# Patient Record
Sex: Male | Born: 1935 | Race: Black or African American | Hispanic: No | Marital: Married | Smoking: Current every day smoker
Health system: Southern US, Community
[De-identification: ages and names within clinical notes are randomized; demographics above are authoritative.]

## PROBLEM LIST (undated history)

## (undated) DIAGNOSIS — M2041 Other hammer toe(s) (acquired), right foot: Secondary | ICD-10-CM

## (undated) DIAGNOSIS — R413 Other amnesia: Secondary | ICD-10-CM

## (undated) DIAGNOSIS — N400 Enlarged prostate without lower urinary tract symptoms: Secondary | ICD-10-CM

## (undated) DIAGNOSIS — E538 Deficiency of other specified B group vitamins: Secondary | ICD-10-CM

## (undated) DIAGNOSIS — I2510 Atherosclerotic heart disease of native coronary artery without angina pectoris: Secondary | ICD-10-CM

## (undated) DIAGNOSIS — I10 Essential (primary) hypertension: Secondary | ICD-10-CM

## (undated) DIAGNOSIS — E7800 Pure hypercholesterolemia, unspecified: Secondary | ICD-10-CM

## (undated) DIAGNOSIS — E519 Thiamine deficiency, unspecified: Secondary | ICD-10-CM

## (undated) DIAGNOSIS — F1027 Alcohol dependence with alcohol-induced persisting dementia: Secondary | ICD-10-CM

## (undated) DIAGNOSIS — F1020 Alcohol dependence, uncomplicated: Secondary | ICD-10-CM

## (undated) HISTORY — PX: CARDIAC CATHETERIZATION: SHX172

## (undated) HISTORY — PX: HERNIA REPAIR: SHX51

## (undated) HISTORY — DX: Atherosclerotic heart disease of native coronary artery without angina pectoris: I25.10

## (undated) HISTORY — PX: ACHILLES TENDON REPAIR: SUR1153

## (undated) HISTORY — PX: INGUINAL HERNIA REPAIR: SUR1180

## (undated) HISTORY — DX: Benign prostatic hyperplasia without lower urinary tract symptoms: N40.0

## (undated) HISTORY — DX: Thiamine deficiency, unspecified: E51.9

## (undated) HISTORY — DX: Other hammer toe(s) (acquired), right foot: M20.41

## (undated) HISTORY — DX: Pure hypercholesterolemia, unspecified: E78.00

## (undated) HISTORY — DX: Other amnesia: R41.3

## (undated) HISTORY — DX: Alcohol dependence, uncomplicated: F10.20

## (undated) HISTORY — DX: Deficiency of other specified B group vitamins: E53.8

## (undated) HISTORY — DX: Alcohol dependence with alcohol-induced persisting dementia: F10.27

## (undated) HISTORY — DX: Essential (primary) hypertension: I10

---

## 1998-11-05 ENCOUNTER — Emergency Department (HOSPITAL_COMMUNITY): Admission: EM | Admit: 1998-11-05 | Discharge: 1998-11-05 | Payer: Self-pay | Admitting: Emergency Medicine

## 2010-10-07 ENCOUNTER — Encounter
Admission: RE | Admit: 2010-10-07 | Discharge: 2010-10-07 | Payer: Self-pay | Source: Home / Self Care | Attending: Cardiology | Admitting: Cardiology

## 2011-01-27 ENCOUNTER — Other Ambulatory Visit: Payer: Self-pay | Admitting: Cardiology

## 2011-01-27 ENCOUNTER — Ambulatory Visit
Admission: RE | Admit: 2011-01-27 | Discharge: 2011-01-27 | Disposition: A | Discharge status: Home / Self Care | Payer: Medicare Other | Source: Ambulatory Visit | Attending: Cardiology | Admitting: Cardiology

## 2011-01-27 DIAGNOSIS — R55 Syncope and collapse: Secondary | ICD-10-CM

## 2011-02-01 ENCOUNTER — Other Ambulatory Visit: Payer: Self-pay | Admitting: Cardiology

## 2011-02-01 DIAGNOSIS — R55 Syncope and collapse: Secondary | ICD-10-CM

## 2011-02-04 ENCOUNTER — Other Ambulatory Visit: Payer: Medicare Other

## 2011-10-10 ENCOUNTER — Emergency Department (HOSPITAL_COMMUNITY): Payer: Medicare Other

## 2011-10-10 ENCOUNTER — Other Ambulatory Visit: Payer: Self-pay

## 2011-10-10 ENCOUNTER — Emergency Department (HOSPITAL_COMMUNITY)
Admission: EM | Admit: 2011-10-10 | Discharge: 2011-10-10 | Disposition: A | Discharge status: Home / Self Care | Payer: Medicare Other | Attending: Emergency Medicine | Admitting: Emergency Medicine

## 2011-10-10 ENCOUNTER — Encounter: Payer: Self-pay | Admitting: *Deleted

## 2011-10-10 DIAGNOSIS — Z7982 Long term (current) use of aspirin: Secondary | ICD-10-CM | POA: Insufficient documentation

## 2011-10-10 DIAGNOSIS — Z79899 Other long term (current) drug therapy: Secondary | ICD-10-CM | POA: Insufficient documentation

## 2011-10-10 DIAGNOSIS — R079 Chest pain, unspecified: Secondary | ICD-10-CM | POA: Insufficient documentation

## 2011-10-10 DIAGNOSIS — R209 Unspecified disturbances of skin sensation: Secondary | ICD-10-CM | POA: Insufficient documentation

## 2011-10-10 LAB — POCT I-STAT, CHEM 8
Chloride: 105 mEq/L (ref 96–112)
Creatinine, Ser: 0.9 mg/dL (ref 0.50–1.35)
Glucose, Bld: 114 mg/dL — ABNORMAL HIGH (ref 70–99)
HCT: 45 % (ref 39.0–52.0)
Potassium: 3.9 mEq/L (ref 3.5–5.1)
Sodium: 139 mEq/L (ref 135–145)

## 2011-10-10 LAB — DIFFERENTIAL
Lymphocytes Relative: 35 % (ref 12–46)
Monocytes Absolute: 0.7 10*3/uL (ref 0.1–1.0)
Monocytes Relative: 11 % (ref 3–12)
Neutro Abs: 3.6 10*3/uL (ref 1.7–7.7)
Neutrophils Relative %: 53 % (ref 43–77)

## 2011-10-10 LAB — CBC
HCT: 40.7 % (ref 39.0–52.0)
Hemoglobin: 13.3 g/dL (ref 13.0–17.0)
RBC: 4.1 MIL/uL — ABNORMAL LOW (ref 4.22–5.81)
WBC: 6.7 10*3/uL (ref 4.0–10.5)

## 2011-10-10 MED ORDER — LANSOPRAZOLE 15 MG PO CPDR: 15.0000 mg | DELAYED_RELEASE_CAPSULE | Freq: Every day | ORAL | Status: DC

## 2011-10-10 MED ORDER — SODIUM CHLORIDE 0.9 % IV SOLN
INTRAVENOUS | Status: DC
  Administered 2011-10-10: 09:00:00 via INTRAVENOUS

## 2011-10-10 NOTE — ED Notes (Signed)
C/o feeling of pressure in left chest and numbness in left arm for about 2 days now

## 2011-10-10 NOTE — ED Notes (Signed)
Patient transported to X-ray 

## 2011-10-10 NOTE — ED Provider Notes (Signed)
History     CSN: [id number] Arrival date & time: 10/10/2011  8:24 AM   First MD Initiated Contact with Patient 10/10/11 [phone]      Chief Complaint  Patient presents with  . Chest Pain    (Consider location/radiation/quality/duration/timing/severity/associated sxs/prior treatment) The history is provided by the patient.   patient's had some pressure in his left chest and some numbness in his left arm for the last 2 days. He states it comes and goes. It lasts 5-10 minutes. He cannot bring it on he cannot make it go away. No cough or fevers. No nausea vomiting or diarrhea. No diaphoresis. He states exertion does not make it come on. He states it tends to come on when he lays down at night. He's not had pain like this before. He states that he recently quit smoking after smoking a pack a day for years. He does not have any cardiac problems and no smoke. She's not lost any weight. He states that Dr. Gwenyth Bouillon is his primary care Dr. is not of any pain now. He came to the ER today because his daughter made him.  History reviewed. No pertinent past medical history.  Past Surgical History  Procedure Date  . Hernia repair     No family history on file.  History  Substance Use Topics  . Smoking status: Current Everyday Smoker  . Smokeless tobacco: Not on file  . Alcohol Use: Yes      Review of Systems  Constitutional: Negative for activity change and appetite change.  HENT: Negative for neck stiffness.   Eyes: Negative for pain.  Respiratory: Negative for chest tightness and shortness of breath.   Cardiovascular: Positive for chest pain. Negative for leg swelling.  Gastrointestinal: Negative for nausea, vomiting, abdominal pain and diarrhea.  Genitourinary: Negative for flank pain.  Musculoskeletal: Negative for back pain.  Skin: Negative for rash.  Neurological: Positive for numbness. Negative for weakness and headaches.  Psychiatric/Behavioral: Negative for behavioral problems.      Allergies  Review of patient's allergies indicates no known allergies.  Home Medications   Current Outpatient Rx  Name Route Sig Dispense Refill  . AMLODIPINE BESYLATE-VALSARTAN 5-320 MG PO TABS Oral Take 1 tablet by mouth daily.      . ASPIRIN 325 MG PO TABS Oral Take 325 mg by mouth daily.      Marland Kitchen LANSOPRAZOLE 15 MG PO CPDR Oral Take 1 capsule (15 mg total) by mouth daily. 14 capsule 0    BP 169/74  Pulse 78  Temp(Src) 97.7 F (36.5 C) (Oral)  Resp 13  SpO2 100%  Physical Exam  Nursing note and vitals reviewed. Constitutional: He is oriented to person, place, and time. He appears well-developed and well-nourished.  HENT:  Head: Normocephalic and atraumatic.  Eyes: EOM are normal. Pupils are equal, round, and reactive to light.  Neck: Normal range of motion. Neck supple.  Cardiovascular: Normal rate, regular rhythm and normal heart sounds.   No murmur heard. Pulmonary/Chest: Effort normal and breath sounds normal.  Abdominal: Soft. Bowel sounds are normal. He exhibits no distension and no mass. There is no tenderness. There is no rebound and no guarding.  Musculoskeletal: Normal range of motion. He exhibits no edema.  Neurological: He is alert and oriented to person, place, and time. No cranial nerve deficit.  Skin: Skin is warm and dry.  Psychiatric: He has a normal mood and affect.    ED Course  Procedures (including critical care time)  Labs  Reviewed  CBC - Abnormal; Notable for the following:    RBC 4.10 (*)    All other components within normal limits  POCT I-STAT, CHEM 8 - Abnormal; Notable for the following:    Glucose, Bld 114 (*)    All other components within normal limits  DIFFERENTIAL  TROPONIN I  I-STAT, CHEM 8   Dg Chest 2 View  10/10/2011  *RADIOLOGY REPORT*  Clinical Data: Chest pain  CHEST - 2 VIEW  Comparison: 10/07/2010  Findings: Cardiomediastinal silhouette is stable.  No acute infiltrate or pleural effusion.  No pulmonary edema.  Mild  hyperinflation.  Stable small calcified granuloma in right midlung. Mild degenerative changes thoracic spine.  IMPRESSION: Mild hyperinflation.  No active disease.  Mild degenerative changes thoracic spine.  Original Report Authenticated By: Natasha Mead, M.D.     1. Chest pain      Date: 10/10/2011  Rate: 84  Rhythm: normal sinus rhythm  QRS Axis: normal  Intervals: normal  ST/T Wave abnormalities: normal  Conduction Disutrbances:none  Narrative Interpretation:   Old EKG Reviewed: none available    MDM  Patient presents with episodes of left-sided chest pain. Is not associated with exertion.  possible given his EKG is normal. It comes on with him lying down. It is not her cardiac history although he was a smoker. He is pain-free now. The patient's cardiologist is Dr. Sharyn Lull.I discussed the patient with them he will see the patient in followup. I was struck the patient on a PPI and see if it helps.   Juliet Rude. Rubin Payor, MD 10/10/11 1038

## 2011-10-10 NOTE — ED Notes (Signed)
Pt. On monitor. 

## 2011-10-10 NOTE — ED Notes (Signed)
Dr. Pickering MD at bedside. 

## 2011-10-18 ENCOUNTER — Inpatient Hospital Stay (HOSPITAL_BASED_OUTPATIENT_CLINIC_OR_DEPARTMENT_OTHER)
Admission: RE | Admit: 2011-10-18 | Discharge: 2011-10-18 | Disposition: A | Discharge status: Home / Self Care | Payer: Medicare Other | Source: Ambulatory Visit | Attending: Cardiology | Admitting: Cardiology

## 2011-10-18 ENCOUNTER — Encounter (HOSPITAL_BASED_OUTPATIENT_CLINIC_OR_DEPARTMENT_OTHER)
Admission: RE | Disposition: A | Discharge status: Home / Self Care | Payer: Self-pay | Source: Ambulatory Visit | Attending: Cardiology

## 2011-10-18 DIAGNOSIS — E7800 Pure hypercholesterolemia, unspecified: Secondary | ICD-10-CM | POA: Insufficient documentation

## 2011-10-18 DIAGNOSIS — I10 Essential (primary) hypertension: Secondary | ICD-10-CM | POA: Insufficient documentation

## 2011-10-18 DIAGNOSIS — R0789 Other chest pain: Secondary | ICD-10-CM | POA: Insufficient documentation

## 2011-10-18 DIAGNOSIS — I2510 Atherosclerotic heart disease of native coronary artery without angina pectoris: Secondary | ICD-10-CM | POA: Insufficient documentation

## 2011-10-18 DIAGNOSIS — F17200 Nicotine dependence, unspecified, uncomplicated: Secondary | ICD-10-CM | POA: Insufficient documentation

## 2011-10-18 SURGERY — JV LEFT HEART CATHETERIZATION WITH CORONARY ANGIOGRAM
Anesthesia: Moderate Sedation

## 2011-10-18 MED ORDER — SODIUM CHLORIDE 0.9 % IV SOLN
INTRAVENOUS | Status: DC
  Administered 2011-10-18: 07:00:00 via INTRAVENOUS

## 2011-10-18 MED ORDER — OXYCODONE-ACETAMINOPHEN 5-325 MG PO TABS: 1.0000 | ORAL_TABLET | ORAL | Status: DC | PRN

## 2011-10-18 MED ORDER — ONDANSETRON HCL 4 MG/2ML IJ SOLN: 4.0000 mg | Freq: Four times a day (QID) | INTRAMUSCULAR | Status: DC | PRN

## 2011-10-18 MED ORDER — ACETAMINOPHEN 325 MG PO TABS: 650.0000 mg | ORAL_TABLET | ORAL | Status: DC | PRN

## 2011-10-18 MED ORDER — DIAZEPAM 5 MG PO TABS
5.0000 mg | ORAL_TABLET | Freq: Once | ORAL | Status: AC
  Administered 2011-10-18: 5 mg via ORAL

## 2011-10-18 MED ORDER — SODIUM CHLORIDE 0.9 % IV SOLN: INTRAVENOUS | Status: DC

## 2011-10-18 NOTE — H&P (Signed)
  Please see scanned H&P in chart

## 2011-10-18 NOTE — Progress Notes (Signed)
Discharge instructions completed, patient voiced understanding.  Ambulated to bathroom without bleeding from right groin site.  Discharged to home via wheelchair with daughter.

## 2011-10-18 NOTE — Cardiovascular Report (Signed)
NAME:  Michael Sharp, Michael Sharp NO.:  [id number]  MEDICAL RECORD NO.:  [id number]  LOCATION:                                 FACILITY:  PHYSICIAN:  Shea Swalley N. Sharyn Lull, M.D. DATE OF BIRTH:  Jun 09, 1935  DATE OF PROCEDURE: DATE OF DISCHARGE:  10/18/2011                           CARDIAC CATHETERIZATION   PROCEDURE:  Left cardiac cath with selective left and right coronary angiography, LV graphy via right groin using Judkins technique.  INDICATION FOR THE PROCEDURE:  Michael Sharp is a 76 year old black male with past medical history significant for hypertension, hypercholesteremia, tobacco abuse, complains of retrosternal chest pain described as tightness associated with numbness in the left arm.  The patient states he took aspirin with relief of chest pain.  Denies any nausea, vomiting, diaphoresis.  Denies palpitation, lightheadedness, or syncope.  Denies any exertional chest pain but activity is limited. Denies any claudication pain.  Denies any relation of chest pain to food, breathing, or movement.  PAST MEDICAL HISTORY:  As above.  PAST SURGICAL HISTORY:  He had inguinal hernia repair in the past.  SOCIAL HISTORY:  He is married, has 2 children.  Retired from [employer]. He smokes half pack per day for 55 years.  Continues to smoke.  He drinks beer socially.  ALLERGIES:  No known drug allergies.  MEDICATIONS AT HOME: 1. He is on Exforge 5/160 p.o. daily. 2. Enteric-coated aspirin 1 daily. 3. Toprol-XL 25 mg p.o. daily. 4. Nitrostat sublingual p.r.n.  FAMILY HISTORY:  Noncontributory.  PHYSICAL EXAMINATION:  GENERAL: On examination, he was alert, awake, oriented x3, in no acute distress. VITAL SIGNS: Blood pressure of 140/80, pulse was 86 regular. EYES: Conjunctivae were pink. NECK: Supple.  No JVD.  No bruits. LUNGS: Clear to auscultation without rhonchi or rales. CARDIOVASCULAR: S1, S2 are normal.  There was soft systolic murmur.  No S3 or  gallop. ABDOMEN: Soft.  Bowel sounds were present, nontender. EXTREMITIES:  There is no clubbing, cyanosis, or edema.  Discussed with the patient at length regarding noninvasive stress testing versus left cath, its risks and benefits, i.e., death, MI, stroke, local vascular complications, etc. and consented for the procedure.  PROCEDURE:  After obtaining the informed consent, the patient was brought to the cath lab and was placed on fluoroscopy table.  Right groin was prepped and draped in the usual fashion.  Xylocaine 1% was used for local anesthesia in the right groin.  With the help of thin wall needle, 4-French arterial sheath was placed.  The sheath was aspirated and flushed.  Next, 4-French left Judkins catheter was advanced over the wire under fluoroscopic guidance up to the ascending aorta.  Wire was pulled out.  The catheter was aspirated and connected to the Manifold.  Catheter was further advanced and engaged into left coronary ostium.  Multiple views of the left system were taken.  Next, catheter was disengaged and was replaced with 4-French 3D right diagnostic catheter which was advanced over the wire under fluoroscopic guidance up to the ascending aorta.  Wire was pulled out.  The catheter was aspirated and connected to the Manifold.  Catheter was further advanced and engaged into the right coronary ostium.  Multiple views of the right system were taken.  Next, catheter was disengaged and was pulled out over the wire and was replaced with 4-French pigtail catheter, which was advanced over the wire under fluoroscopic guidance up to the ascending aorta.  Catheter was further advanced across the aortic valve into the LV.  LV pressures were recorded.  Next, LV graft was done in 30 degree RAO position.  Postangiographic pressures were recorded from LV and then pullback pressures were recorded from the aorta.  There was no gradient across the aortic valve.  Next, the pigtail  catheter was pulled out over the wire.  Sheaths were aspirated and flushed.  FINDINGS:  LV showed good LV systolic function, mild LVH, EF of 55-60%. Left main was patent.  LAD has 15-20% mid stenosis.  Diagonal 1 to diagonal 3 were very very small, which were patent.  Left circumflex was 5-10% proximal stenosis.  OM-1 has 20-30% ostial stenosis.  OM-2 was very far with 25% proximal stenosis.  RCA has 5-10% mid stenosis.  PDA and PLV branches were patent.  The patient tolerated procedure well.  There were no complications.  The patient was transferred to the recovery room.     Michael Sharp. Sharyn Lull, M.D.     MNH/MEDQ  D:  10/18/2011  T:  10/18/2011  Job:  [id number]

## 2011-10-18 NOTE — Op Note (Signed)
Cardiac cath report dictated on 10/17/2004 dictation number is [id number]

## 2011-10-18 NOTE — Progress Notes (Signed)
4 F arterial sheath removed by Ashley Murrain post cardiac cath without difficulty.  Manual pressure held  For 20 minutes.

## 2011-10-18 NOTE — Progress Notes (Signed)
Bedrest begins @ 24.  Activity limitations discussed.

## 2011-12-14 DIAGNOSIS — H2510 Age-related nuclear cataract, unspecified eye: Secondary | ICD-10-CM | POA: Diagnosis not present

## 2011-12-14 DIAGNOSIS — H40029 Open angle with borderline findings, high risk, unspecified eye: Secondary | ICD-10-CM | POA: Diagnosis not present

## 2011-12-14 DIAGNOSIS — H524 Presbyopia: Secondary | ICD-10-CM | POA: Diagnosis not present

## 2012-01-26 DIAGNOSIS — I10 Essential (primary) hypertension: Secondary | ICD-10-CM | POA: Diagnosis not present

## 2012-01-26 DIAGNOSIS — I2510 Atherosclerotic heart disease of native coronary artery without angina pectoris: Secondary | ICD-10-CM | POA: Diagnosis not present

## 2012-01-26 DIAGNOSIS — E7800 Pure hypercholesterolemia, unspecified: Secondary | ICD-10-CM | POA: Diagnosis not present

## 2012-05-01 DIAGNOSIS — I10 Essential (primary) hypertension: Secondary | ICD-10-CM | POA: Diagnosis not present

## 2012-05-01 DIAGNOSIS — I2510 Atherosclerotic heart disease of native coronary artery without angina pectoris: Secondary | ICD-10-CM | POA: Diagnosis not present

## 2012-08-13 DIAGNOSIS — Z79899 Other long term (current) drug therapy: Secondary | ICD-10-CM | POA: Diagnosis not present

## 2012-08-14 DIAGNOSIS — H40029 Open angle with borderline findings, high risk, unspecified eye: Secondary | ICD-10-CM | POA: Diagnosis not present

## 2012-08-22 DIAGNOSIS — Z79899 Other long term (current) drug therapy: Secondary | ICD-10-CM | POA: Diagnosis not present

## 2012-08-31 DIAGNOSIS — Z79899 Other long term (current) drug therapy: Secondary | ICD-10-CM | POA: Diagnosis not present

## 2012-09-07 DIAGNOSIS — Z79899 Other long term (current) drug therapy: Secondary | ICD-10-CM | POA: Diagnosis not present

## 2012-09-10 DIAGNOSIS — Z79899 Other long term (current) drug therapy: Secondary | ICD-10-CM | POA: Diagnosis not present

## 2012-09-17 DIAGNOSIS — Z79899 Other long term (current) drug therapy: Secondary | ICD-10-CM | POA: Diagnosis not present

## 2012-12-17 DIAGNOSIS — Z79899 Other long term (current) drug therapy: Secondary | ICD-10-CM | POA: Diagnosis not present

## 2013-04-02 DIAGNOSIS — H40029 Open angle with borderline findings, high risk, unspecified eye: Secondary | ICD-10-CM | POA: Diagnosis not present

## 2013-04-29 DIAGNOSIS — M7150 Other bursitis, not elsewhere classified, unspecified site: Secondary | ICD-10-CM | POA: Diagnosis not present

## 2013-04-29 DIAGNOSIS — M79609 Pain in unspecified limb: Secondary | ICD-10-CM | POA: Diagnosis not present

## 2013-04-29 DIAGNOSIS — M2040 Other hammer toe(s) (acquired), unspecified foot: Secondary | ICD-10-CM | POA: Diagnosis not present

## 2013-05-13 DIAGNOSIS — M25579 Pain in unspecified ankle and joints of unspecified foot: Secondary | ICD-10-CM | POA: Diagnosis not present

## 2013-05-13 DIAGNOSIS — M2040 Other hammer toe(s) (acquired), unspecified foot: Secondary | ICD-10-CM | POA: Diagnosis not present

## 2014-03-17 DIAGNOSIS — D2370 Other benign neoplasm of skin of unspecified lower limb, including hip: Secondary | ICD-10-CM | POA: Diagnosis not present

## 2014-12-18 ENCOUNTER — Encounter: Payer: Self-pay | Admitting: Internal Medicine

## 2014-12-18 ENCOUNTER — Ambulatory Visit (INDEPENDENT_AMBULATORY_CARE_PROVIDER_SITE_OTHER): Payer: Medicare Other | Admitting: Internal Medicine

## 2014-12-18 VITALS — BP 168/93 | HR 111 | Temp 98.2°F | Resp 16 | Ht 72.0 in | Wt 171.0 lb

## 2014-12-18 DIAGNOSIS — I10 Essential (primary) hypertension: Secondary | ICD-10-CM | POA: Diagnosis not present

## 2014-12-18 DIAGNOSIS — Z720 Tobacco use: Secondary | ICD-10-CM

## 2014-12-18 DIAGNOSIS — F17200 Nicotine dependence, unspecified, uncomplicated: Secondary | ICD-10-CM | POA: Insufficient documentation

## 2014-12-18 DIAGNOSIS — R102 Pelvic and perineal pain: Secondary | ICD-10-CM | POA: Insufficient documentation

## 2014-12-18 DIAGNOSIS — R1030 Lower abdominal pain, unspecified: Secondary | ICD-10-CM | POA: Diagnosis not present

## 2014-12-18 DIAGNOSIS — Z23 Encounter for immunization: Secondary | ICD-10-CM | POA: Diagnosis not present

## 2014-12-18 DIAGNOSIS — R109 Unspecified abdominal pain: Secondary | ICD-10-CM | POA: Diagnosis not present

## 2014-12-18 LAB — CBC WITH DIFFERENTIAL/PLATELET
Basophils Absolute: 0 10*3/uL (ref 0.0–0.1)
Basophils Relative: 0 % (ref 0–1)
EOS ABS: 0 10*3/uL (ref 0.0–0.7)
Eosinophils Relative: 0 % (ref 0–5)
HEMATOCRIT: 46.5 % (ref 39.0–52.0)
HEMOGLOBIN: 15.9 g/dL (ref 13.0–17.0)
LYMPHS ABS: 1.8 10*3/uL (ref 0.7–4.0)
LYMPHS PCT: 36 % (ref 12–46)
MCH: 34.7 pg — ABNORMAL HIGH (ref 26.0–34.0)
MCHC: 34.2 g/dL (ref 30.0–36.0)
MCV: 101.5 fL — ABNORMAL HIGH (ref 78.0–100.0)
MPV: 9.7 fL (ref 8.6–12.4)
Monocytes Absolute: 0.7 10*3/uL (ref 0.1–1.0)
Monocytes Relative: 14 % — ABNORMAL HIGH (ref 3–12)
NEUTROS ABS: 2.5 10*3/uL (ref 1.7–7.7)
NEUTROS PCT: 50 % (ref 43–77)
Platelets: 180 10*3/uL (ref 150–400)
RBC: 4.58 MIL/uL (ref 4.22–5.81)
RDW: 13.8 % (ref 11.5–15.5)
WBC: 5 10*3/uL (ref 4.0–10.5)

## 2014-12-18 MED ORDER — AMLODIPINE BESYLATE 5 MG PO TABS: 5.0000 mg | ORAL_TABLET | Freq: Every day | ORAL | Status: DC

## 2014-12-18 MED ORDER — HYDROCHLOROTHIAZIDE 12.5 MG PO CAPS: 12.5000 mg | ORAL_CAPSULE | Freq: Every day | ORAL | Status: DC

## 2014-12-18 NOTE — Progress Notes (Signed)
Patient ID: Michael Sharp, male   DOB: 27-Oct-1935, 80 y.o.   MRN: [id number]   Michael Sharp, is a 80 y.o. male  WUJ:[id number]  NFA:[id number]  DOB - [birth year]  CC:  Chief Complaint  Patient presents with  . Establish Care    discomfort around lower abdomen x 3 weeks        HPI: Michael Sharp is a 80 y.o. male here today to establish medical care. He reports that he has had no problems and is healthy. He was last seen by Dr Wallene Huh and thinks that his last visit was more than 2 years ago. He does smoke but denies any SOB or DOE  He also c/o pain in the supra pubic region which started several weeks ago. He denies any urinary symptoms and states that his bowel and bladder habits have been normal.  Patient has No headache, No chest pain, No abdominal pain - No Nausea, No new weakness tingling or numbness, No Cough - SOB.  No Known Allergies History reviewed. No pertinent past medical history. Current Outpatient Prescriptions on File Prior to Visit  Medication Sig Dispense Refill  . aspirin 325 MG tablet Take 325 mg by mouth daily.       No current facility-administered medications on file prior to visit.   Family History  Problem Relation Age of Onset  . Cancer Mother   . Cancer Father    History   Social History  . Marital Status: Married    Spouse Name: N/A  . Number of Children: 2  . Years of Education: College   Occupational History  . Not on file.   Social History Main Topics  . Smoking status: Current Every Day Smoker -- 0.50 packs/day for 60 years    Types: Cigarettes  . Smokeless tobacco: Not on file  . Alcohol Use: 8.4 oz/week    14 Cans of beer per week  . Drug Use: No  . Sexual Activity: Not on file   Other Topics Concern  . Not on file   Social History Narrative    Review of Systems: Constitutional: Negative for fever, chills, diaphoresis, activity change, appetite change and fatigue. HENT: Negative for ear pain, nosebleeds,  congestion, facial swelling, rhinorrhea, neck pain, neck stiffness and ear discharge.  Eyes: Negative for pain, discharge, redness, itching and visual disturbance. Respiratory: Negative for cough, choking, chest tightness, shortness of breath, wheezing and stridor.  Cardiovascular: Negative for chest pain, palpitations and leg swelling. Gastrointestinal: Negative for abdominal distention. Genitourinary: Negative for dysuria, urgency, frequency, hematuria, flank pain, decreased urine volume, difficulty urinating and dyspareunia.  Musculoskeletal: Negative for back pain, joint swelling, arthralgia and gait problem. Neurological: Negative for dizziness, tremors, seizures, syncope, facial asymmetry, speech difficulty, weakness, light-headedness, numbness and headaches.  Hematological: Negative for adenopathy. Does not bruise/bleed easily. Psychiatric/Behavioral: Negative for hallucinations, behavioral problems, confusion, dysphoric mood, decreased concentration and agitation.     Objective:    Filed Vitals:   12/18/14 1401  BP: 168/93  Pulse: 111  Temp: 98.2 F (36.8 C)  Resp: 16    Physical Exam: Constitutional: Patient appears well-developed and well-nourished. No distress. HENT: Normocephalic, atraumatic, External right and left ear normal. Oropharynx is clear and moist.  Eyes: Conjunctivae and EOM are normal. PERRLA, no scleral icterus. Neck: Normal ROM. Neck supple. No JVD. No tracheal deviation. No thyromegaly. CVS: RRR, S1/S2 +, no murmurs, no gallops, no carotid bruit.  Pulmonary: Effort and breath sounds normal, no stridor, rhonchi, wheezes, rales.  Abdominal: Soft. BS +, no distension, tenderness, rebound or guarding.  Musculoskeletal: Normal range of motion. No edema and no tenderness.  Lymphadenopathy: No lymphadenopathy noted, cervical, inguinal or axillary Neuro: Alert. Normal reflexes, muscle tone coordination. No cranial nerve deficit. Skin: Skin is warm and dry. No  rash noted. Not diaphoretic. No erythema. No pallor. Psychiatric: Normal mood and affect. Behavior, judgment, thought content normal.   Lab Results  Component Value Date   WBC 6.7 10/10/2011   HGB 15.3 10/10/2011   HCT 45.0 10/10/2011   MCV 99.3 10/10/2011   PLT 184 10/10/2011   Lab Results  Component Value Date   CREATININE 0.90 10/10/2011   BUN 13 10/10/2011   NA 139 10/10/2011   K 3.9 10/10/2011   CL 105 10/10/2011    No results found for: HGBA1C Lipid Panel  No results found for: CHOL, TRIG, HDL, CHOLHDL, VLDL, LDLCALC     Assessment and plan:   1. Essential hypertension - I have started Norvasc  - Comprehensive metabolic panel - amLODipine (NORVASC) 5 MG tablet; Take 1 tablet (5 mg total) by mouth daily.  Dispense: 90 tablet; Refill: 3 - hydrochlorothiazide (MICROZIDE) 12.5 MG capsule; Take 1 capsule (12.5 mg total) by mouth daily.  Dispense: 30 capsule; Refill: 1  2. Abdominal pain, suprapubic, unspecified laterality - The pain is diffuse and clinical course unclear based on history given by patient. Will evaluate for UTI and diverticular disease. He has no signs of appendicitis and no evidence of hernia. - CBC with Differential/Platelet - Urinalysis with Culture Reflex - CT Abdomen Pelvis W Contrast; Future  3. Tobacco use disorder - Discussed tobacco cessation,. He feels that he will be successful with patches. I have given him th number to the quit line. He is approaching hte contemplative stage and will continue with brief interventions.  4. Immunization due - Pneumococcal conjugate vaccine 13-valent   Follow-up in 1 month for Annual visit, HTN and review of labs.  The patient was given clear instructions to go to ER or return to medical center if symptoms don't improve, worsen or new problems develop. The patient verbalized understanding. The patient was told to call to get lab results if they haven't heard anything in the next week.     This note has  been created with Surveyor, quantity. Any transcriptional errors are unintentional.    MATTHEWS,MICHELLE A., MD [place], [place]   12/18/2014, 3:03 PM

## 2014-12-19 LAB — URINALYSIS W MICROSCOPIC + REFLEX CULTURE
BILIRUBIN URINE: NEGATIVE
Bacteria, UA: NONE SEEN
Casts: NONE SEEN
Crystals: NONE SEEN
GLUCOSE, UA: NEGATIVE mg/dL
HGB URINE DIPSTICK: NEGATIVE
KETONES UR: NEGATIVE mg/dL
Leukocytes, UA: NEGATIVE
Nitrite: NEGATIVE
PROTEIN: NEGATIVE mg/dL
Specific Gravity, Urine: 1.008 (ref 1.005–1.030)
Squamous Epithelial / LPF: NONE SEEN
Urobilinogen, UA: 0.2 mg/dL (ref 0.0–1.0)
pH: 6 (ref 5.0–8.0)

## 2014-12-19 LAB — COMPREHENSIVE METABOLIC PANEL
ALBUMIN: 4.2 g/dL (ref 3.5–5.2)
ALT: 20 U/L (ref 0–53)
AST: 29 U/L (ref 0–37)
Alkaline Phosphatase: 71 U/L (ref 39–117)
BUN: 7 mg/dL (ref 6–23)
CALCIUM: 9.6 mg/dL (ref 8.4–10.5)
CHLORIDE: 103 meq/L (ref 96–112)
CO2: 22 meq/L (ref 19–32)
Creat: 1.01 mg/dL (ref 0.50–1.35)
Glucose, Bld: 88 mg/dL (ref 70–99)
POTASSIUM: 4.3 meq/L (ref 3.5–5.3)
SODIUM: 135 meq/L (ref 135–145)
TOTAL PROTEIN: 7.8 g/dL (ref 6.0–8.3)
Total Bilirubin: 0.8 mg/dL (ref 0.2–1.2)

## 2014-12-29 ENCOUNTER — Ambulatory Visit (HOSPITAL_COMMUNITY)
Admission: RE | Admit: 2014-12-29 | Discharge: 2014-12-29 | Disposition: A | Discharge status: Home / Self Care | Payer: Medicare Other | Source: Ambulatory Visit | Attending: Internal Medicine | Admitting: Internal Medicine

## 2014-12-29 ENCOUNTER — Other Ambulatory Visit (HOSPITAL_COMMUNITY): Payer: Medicare Other

## 2014-12-29 ENCOUNTER — Other Ambulatory Visit: Payer: Self-pay | Admitting: Internal Medicine

## 2014-12-29 ENCOUNTER — Encounter (HOSPITAL_COMMUNITY): Payer: Self-pay

## 2014-12-29 DIAGNOSIS — N400 Enlarged prostate without lower urinary tract symptoms: Secondary | ICD-10-CM

## 2014-12-29 DIAGNOSIS — R1030 Lower abdominal pain, unspecified: Secondary | ICD-10-CM | POA: Insufficient documentation

## 2014-12-29 DIAGNOSIS — R932 Abnormal findings on diagnostic imaging of liver and biliary tract: Secondary | ICD-10-CM

## 2014-12-29 DIAGNOSIS — N281 Cyst of kidney, acquired: Secondary | ICD-10-CM | POA: Diagnosis not present

## 2014-12-29 DIAGNOSIS — K5730 Diverticulosis of large intestine without perforation or abscess without bleeding: Secondary | ICD-10-CM | POA: Diagnosis not present

## 2014-12-29 MED ORDER — IOHEXOL 300 MG/ML  SOLN
100.0000 mL | Freq: Once | INTRAMUSCULAR | Status: AC | PRN
  Administered 2014-12-29: 100 mL via INTRAVENOUS

## 2014-12-29 NOTE — Progress Notes (Signed)
CT results call from Radiology call noting abnormality at head of pancreas. Will order MRI with Pancreas protocol. Also noted enlarged prostate. Will order PSA to be done prior to next visit

## 2014-12-30 ENCOUNTER — Ambulatory Visit (INDEPENDENT_AMBULATORY_CARE_PROVIDER_SITE_OTHER): Payer: Medicare Other

## 2014-12-30 DIAGNOSIS — N400 Enlarged prostate without lower urinary tract symptoms: Secondary | ICD-10-CM

## 2014-12-31 LAB — PSA: PSA: 4.8 ng/mL — ABNORMAL HIGH (ref <=4.00)

## 2015-01-12 ENCOUNTER — Other Ambulatory Visit: Payer: Self-pay | Admitting: Internal Medicine

## 2015-01-12 ENCOUNTER — Ambulatory Visit (HOSPITAL_COMMUNITY)
Admission: RE | Admit: 2015-01-12 | Discharge: 2015-01-12 | Disposition: A | Discharge status: Home / Self Care | Payer: Medicare Other | Source: Ambulatory Visit | Attending: Internal Medicine | Admitting: Internal Medicine

## 2015-01-12 DIAGNOSIS — R932 Abnormal findings on diagnostic imaging of liver and biliary tract: Secondary | ICD-10-CM | POA: Diagnosis not present

## 2015-01-12 DIAGNOSIS — R109 Unspecified abdominal pain: Secondary | ICD-10-CM | POA: Diagnosis not present

## 2015-01-12 DIAGNOSIS — K760 Fatty (change of) liver, not elsewhere classified: Secondary | ICD-10-CM | POA: Insufficient documentation

## 2015-01-12 MED ORDER — GADOBENATE DIMEGLUMINE 529 MG/ML IV SOLN
20.0000 mL | Freq: Once | INTRAVENOUS | Status: AC | PRN
  Administered 2015-01-12: 16 mL via INTRAVENOUS

## 2015-01-19 ENCOUNTER — Other Ambulatory Visit (INDEPENDENT_AMBULATORY_CARE_PROVIDER_SITE_OTHER): Payer: Medicare Other

## 2015-01-19 ENCOUNTER — Other Ambulatory Visit: Payer: Self-pay | Admitting: Family Medicine

## 2015-01-19 DIAGNOSIS — I10 Essential (primary) hypertension: Secondary | ICD-10-CM | POA: Diagnosis not present

## 2015-01-20 LAB — LIPID PANEL
CHOL/HDL RATIO: 4.3 ratio
CHOLESTEROL: 205 mg/dL — AB (ref 0–200)
HDL: 48 mg/dL (ref >=40)
LDL CALC: 121 mg/dL — AB (ref 0–99)
Triglycerides: 180 mg/dL — ABNORMAL HIGH (ref <150)
VLDL: 36 mg/dL (ref 0–40)

## 2015-01-26 ENCOUNTER — Ambulatory Visit (INDEPENDENT_AMBULATORY_CARE_PROVIDER_SITE_OTHER): Payer: Medicare Other | Admitting: Internal Medicine

## 2015-01-26 ENCOUNTER — Encounter: Payer: Self-pay | Admitting: Internal Medicine

## 2015-01-26 VITALS — BP 181/94 | HR 118 | Temp 99.2°F | Resp 16 | Ht 72.0 in | Wt 165.0 lb

## 2015-01-26 DIAGNOSIS — F17200 Nicotine dependence, unspecified, uncomplicated: Secondary | ICD-10-CM

## 2015-01-26 DIAGNOSIS — R269 Unspecified abnormalities of gait and mobility: Secondary | ICD-10-CM

## 2015-01-26 DIAGNOSIS — I10 Essential (primary) hypertension: Secondary | ICD-10-CM | POA: Diagnosis not present

## 2015-01-26 DIAGNOSIS — R251 Tremor, unspecified: Secondary | ICD-10-CM

## 2015-01-26 DIAGNOSIS — F1024 Alcohol dependence with alcohol-induced mood disorder: Secondary | ICD-10-CM | POA: Insufficient documentation

## 2015-01-26 DIAGNOSIS — Z23 Encounter for immunization: Secondary | ICD-10-CM | POA: Diagnosis not present

## 2015-01-26 DIAGNOSIS — F1020 Alcohol dependence, uncomplicated: Secondary | ICD-10-CM

## 2015-01-26 DIAGNOSIS — G3184 Mild cognitive impairment, so stated: Secondary | ICD-10-CM

## 2015-01-26 DIAGNOSIS — R000 Tachycardia, unspecified: Secondary | ICD-10-CM

## 2015-01-26 DIAGNOSIS — R258 Other abnormal involuntary movements: Secondary | ICD-10-CM

## 2015-01-26 DIAGNOSIS — Z720 Tobacco use: Secondary | ICD-10-CM

## 2015-01-26 DIAGNOSIS — E785 Hyperlipidemia, unspecified: Secondary | ICD-10-CM | POA: Diagnosis not present

## 2015-01-26 LAB — COMPREHENSIVE METABOLIC PANEL
ALBUMIN: 4.1 g/dL (ref 3.5–5.2)
ALK PHOS: 71 U/L (ref 39–117)
ALT: 23 U/L (ref 0–53)
AST: 30 U/L (ref 0–37)
BUN: 8 mg/dL (ref 6–23)
CHLORIDE: 102 meq/L (ref 96–112)
CO2: 23 mEq/L (ref 19–32)
Calcium: 9.7 mg/dL (ref 8.4–10.5)
Creat: 0.98 mg/dL (ref 0.50–1.35)
Glucose, Bld: 94 mg/dL (ref 70–99)
POTASSIUM: 4.2 meq/L (ref 3.5–5.3)
SODIUM: 137 meq/L (ref 135–145)
TOTAL PROTEIN: 7.7 g/dL (ref 6.0–8.3)
Total Bilirubin: 0.8 mg/dL (ref 0.2–1.2)

## 2015-01-26 MED ORDER — ATORVASTATIN CALCIUM 20 MG PO TABS
20.0000 mg | ORAL_TABLET | Freq: Every day | ORAL | Status: DC
Start: 2015-01-26 — End: 2016-03-21

## 2015-01-26 MED ORDER — FOLIC ACID 1 MG PO TABS: 1.0000 mg | ORAL_TABLET | Freq: Every day | ORAL | Status: DC

## 2015-01-26 MED ORDER — VITAMIN B-1 50 MG PO TABS: 50.0000 mg | ORAL_TABLET | Freq: Every day | ORAL | Status: DC

## 2015-01-26 NOTE — Progress Notes (Signed)
Patient ID: Michael Sharp, male   DOB: 1935-10-23, 80 y.o.   MRN: [id number]  HPI 80 y.o. male  presents for 3 month follow up with hypertension, hyperlipidemia, abdominal pain. His blood pressure has not been controlled at home, today their BP is BP: (!) 181/94 mmHg He does not workout. He denies chest pain, shortness of breath, dizziness.  He is not on cholesterol medication and denies myalgias. His cholesterol is not at goal. The cholesterol was:  01/19/2015: Cholesterol, Total 205*; HDL-C 48; LDL (calc) 121*; Triglycerides 180* No results found for requested labs within last 365 days. Patient is not on Vitamin D supplement. No results found for requested labs within last 365 days.   Pt's wife is present today and states that he has not been compliant with his medications. Additionally he drinks ETOH "all day long". Pt admits to drinking 6 beers/day. However his wife reports that she drinks an average of 10 beers/day and can drink up to 24 in a day. She reports that he also drinks Liquor ([employer]). He has not thought about cutting down, gets angry when criticized about drinking, does not feel guilty but does have a eye-opener. (Score 2/4: significant)  Pt also exhibiting confusion around simple instructions. I performed a MMS examination and patient scored 20/30.   Current Medications:  Current Outpatient Prescriptions on File Prior to Visit  Medication Sig Dispense Refill  . amLODipine (NORVASC) 5 MG tablet Take 1 tablet (5 mg total) by mouth daily. 90 tablet 3  . aspirin 325 MG tablet Take 325 mg by mouth daily.      . hydrochlorothiazide (MICROZIDE) 12.5 MG capsule Take 1 capsule (12.5 mg total) by mouth daily. 30 capsule 1  . Multiple Vitamin (MULTIVITAMIN) tablet Take 1 tablet by mouth daily.     No current facility-administered medications on file prior to visit.   Medical History: No past medical history on file. Allergies: No Known Allergies   Review of Systems:  Review  of Systems  Constitutional: Negative.   HENT: Negative.   Eyes: Negative.   Respiratory: Negative.   Cardiovascular: Negative.   Gastrointestinal: Negative.   Genitourinary: Negative.   Musculoskeletal: Negative.   Skin: Negative.   Neurological: Positive for tremors (If he does not have a drink in the morning.).  Endo/Heme/Allergies: Negative.   Psychiatric/Behavioral: Positive for memory loss and substance abuse. Negative for hallucinations. The patient is nervous/anxious.     Family history- Review and unchanged Social history- Review and unchanged Physical Exam: BP 181/94 mmHg  Pulse 118  Temp(Src) 99.2 F (37.3 C) (Oral)  Resp 16  Ht 6' (1.829 m)  Wt 165 lb (74.844 kg)  BMI 22.37 kg/m2  SpO2 100% Wt Readings from Last 3 Encounters:  01/26/15 165 lb (74.844 kg)  12/18/14 171 lb (77.565 kg)  10/18/11 170 lb (77.111 kg)   General Appearance: Malnourished, in no apparent distress. Eyes: PERRLA, EOMs, conjunctiva no swelling or erythema Sinuses: No Frontal/maxillary tenderness ENT/Mouth: Ext aud canals clear, TMs without erythema, bulging. No erythema, swelling, or exudate on post pharynx.  Tonsils not swollen or erythematous. Hearing normal.  Neck: Supple, thyroid normal. No JVD or Carotid Bruit Respiratory: Respiratory effort normal, BS equal bilaterally without rales, rhonchi, wheezing or stridor.  Cardio:Mild tachycardia with no MRGs. Brisk peripheral pulses without edema.  Abdomen: Soft, + BS.  Non tender, no guarding, rebound, hernias, masses. Lymphatics: Non tender without lymphadenopathy.  Musculoskeletal: Full ROM, normal strength, normal gait.  Skin: Warm, dry without  rashes, lesions, ecchymosis.  Neuro: Cranial nerves intact.Positive Romberg sign, unable to tandem walk but no shuffling noted in his gait pattern  noted. Pt exhibits intention tremor and bradykinesia, and also has micrographia. Sensation is intact.  Psych: Awake and oriented X 3, normal affect,  Insight and Judgment distorted.  MMS score 20/30  Assessment and Plan:  1. Hyperlipidemia LDL goal <100 - Pt has multiple risk factors for ASCVD. Will start on Lipitor 20 mg. - Comprehensive metabolic panel - atorvastatin (LIPITOR) 20 MG tablet; Take 1 tablet (20 mg total) by mouth daily.  Dispense: 90 tablet; Refill: 3  2. Essential hypertension - Pt has not been compliant with antihypertensive medications. So will continue current medications. I have discussed with Mr. Zukas and his wife the role of ETOH in maintaining elevated blood pressures, as well as the problem associated with ETOH dependence  And withdrawal syndrome. Also continue to .monitor blood pressure at home. Continue DASH diet. Reminder to go to the ER if any CP, SOB, nausea, dizziness, severe HA, changes vision/speech, left arm numbness and tingling and jaw pain. - Continue diet and exercise. Check cholesterol 6 weeks  -  Comprehensive metabolic panel  3. Tachycardia - my suspicion is that patient is having some mild withdrawal symptoms as he has had no ETOH today in anticipation of his Physician office visit. However I cannot ignore the possibilityof  - Ethanol - EKG 12-Lead - Comprehensive metabolic panel - TSH  4. Mild Cognitive Impairment - Will refer to Neurology. The differential includes, ETOH associated cognitive impairment, Parkinsonism and  Early Alzheimer's dementia. I do not think that imaging will be helpful in the setting of non-focal deficit.  - - thiamine (VITAMIN B-1) 50 MG tablet; Take 1 tablet (50 mg total) by mouth daily.  Dispense: 30 tablet; Refill: 11 - folic acid (FOLVITE) 1 MG tablet; Take 1 tablet (1 mg total) by mouth daily.  Dispense: 30 tablet; Refill: 11 - TSH  5. Uncomplicated alcohol dependence - Pt in stage of frank denial.  - Will start on Thiamine and folic acid. - Comprehensive metabolic panel  6. Need for Tdap vaccination - Tdap vaccine greater than or equal to 7yo IM  7.  Alcoholism - Counseled patient and wife on risks associated with ETOH dependence. Advised that he should start cutting down. However patient is in denial of his ETOH intake and is not ready to quit. - thiamine (VITAMIN B-1) 50 MG tablet; Take 1 tablet (50 mg total) by mouth daily.  Dispense: 30 tablet; Refill: 11 - folic acid (FOLVITE) 1 MG tablet; Take 1 tablet (1 mg total) by mouth daily.  Dispense: 30 tablet; Refill: 11    Continue diet and meds as discussed. Further disposition pending results of labs. Discussed medication effects and side effects.    Nandini Bogdanski A., MD 2:54 PM [facility]

## 2015-01-26 NOTE — Patient Instructions (Signed)
Mild Neurocognitive Disorder Mild neurocognitive disorder (formerly known as mild cognitive impairment) is a mental disorder. It is a slight abnormal decrease in mental function. The areas of mental function affected may include memory, thought, communication, behavior, and completion of tasks. The decrease is noticeable and measurable but for the most part does not interfere with your daily activities. Mild neurocognitive disorder typically occurs in people older than 60 years but can occur earlier. It is not as serious as major neurocognitive disorder (formerly known as dementia) but may lead to a more serious neurocognitive disorder. However, in some cases the condition does not get worse. A few people with this disorder even improve. CAUSES  There are a number of different causes of mild neurocognitive disorder:   Brain disorders associated with abnormal protein deposits, such as Alzheimer's disease, Pick's disease, and Lewy body disease.  Brain disorders associated with abnormal movement, such as Parkinson's disease and Huntington's disease.  Diseases affecting blood vessels in the brain and resulting in mini-strokes.  Certain infections, such as human immunodeficiency virus (HIV) infection.  Traumatic brain injury.  Other medical conditions such as brain tumors, underactive thyroid (hypothyroidism), and vitamin B12 deficiency.  Use of certain prescription medicine and "recreational" drugs. SYMPTOMS  Symptoms of mild neurocognitive disorder include:  Difficulty remembering. You may forget details of recent events, names, or phone numbers. You may forget important social events and appointments or repeatedly forget where you put your car keys.  Difficulty thinking and solving problems. You may have trouble with complex tasks such as paying bills or driving in unfamiliar locations.  Difficulty communicating. You may have trouble finding the right word, naming an object, forming a  sentence that makes sense, or understanding what you read or hear.  Changes in your behavior or personality. You may lose interest in the things that you used to enjoy or withdraw from social situations. You may get angry more easily than usual. You may act before thinking. You may do things in public that you would not usually do. You may hear or see things that are not real (hallucinations). You may believe falsely that others are trying to hurt you (paranoia). DIAGNOSIS Mild neurocognitive disorder is diagnosed through an assessment by your health care provider. Your health care provider will ask you and your family, friends, or coworkers questions about your symptoms. He or she will ask how often the symptoms occur, how long they have been occurring, whether they are getting worse, and the effect they are having on your life. Your health care provider may refer you to a neurologist or mental health specialist for a detailed evaluation of your mental functions (neuropsychological testing).  To identify the cause of your mild neurocognitive disorder, your health care provider may:  Obtain a detailed medical history.  Ask about alcohol and drug use, including prescription medicine.  Perform a physical exam.  Order blood tests and brain imaging exams. TREATMENT  Mild neurocognitive disorder caused by infections, use of certain medicines or "recreational" drugs, and certain medical conditions may improve with treatment of the condition that is causing the disorder. Mild neurocognitive disorder resulting from other causes generally does not improve and may worsen. In these cases, the goal of treatment is to slow progression of the disorder and help you cope with the loss of mental function. Treatments in these cases include:   Medicine. Medicine helps mainly with memory loss and behavioral symptoms.   Talk therapy. Talk therapy provides education, emotional support, memory aids, and other   include:   · Medicine. Medicine helps mainly with memory loss and behavioral symptoms.    · Talk therapy. Talk therapy provides education, emotional support, memory aids, and other ways of  making up for decreases in mental function.       · Lifestyle changes. These include regular exercise, a healthy diet (including essential omega-3 fatty acids), intellectual stimulation, and increased social interaction.  Document Released: 06/19/2013 Document Revised: 03/03/2014 Document Reviewed: 06/19/2013  [employer]® Patient Information ©2015 [employer]. This information is not intended to replace advice given to you by your health care provider. Make sure you discuss any questions you have with your health care provider.

## 2015-01-27 LAB — ETHANOL

## 2015-02-05 ENCOUNTER — Encounter: Payer: Self-pay | Admitting: Neurology

## 2015-02-05 ENCOUNTER — Ambulatory Visit (INDEPENDENT_AMBULATORY_CARE_PROVIDER_SITE_OTHER): Payer: Medicare Other | Admitting: Neurology

## 2015-02-05 VITALS — BP 138/84 | HR 88 | Ht 72.0 in | Wt 168.0 lb

## 2015-02-05 DIAGNOSIS — R413 Other amnesia: Secondary | ICD-10-CM | POA: Diagnosis not present

## 2015-02-05 DIAGNOSIS — R251 Tremor, unspecified: Secondary | ICD-10-CM

## 2015-02-05 DIAGNOSIS — F1020 Alcohol dependence, uncomplicated: Secondary | ICD-10-CM | POA: Diagnosis not present

## 2015-02-05 NOTE — Patient Instructions (Signed)
1. Your provider has requested that you have labwork completed today. Please go to [facility] on the first floor of this building before leaving the office today.

## 2015-02-05 NOTE — Progress Notes (Signed)
Subjective:   Michael Sharp was seen in consultation in the movement disorder clinic at the request of MATTHEWS,MICHELLE A., MD.  The evaluation is for tremor.  The records that were made available to me were reviewed.  The patient is a 80 y.o. right handed male with a history of tremor.  Pt states "personally I didn't even know that I had the shakes."  He states that his wife is in the lobby but he won't let her back in the examination room initally but she came back on her own when I was through with the history and already performing the examination.  There is no family hx of tremor.    Affected by caffeine:  No. (2 "normal" cups of coffee per day) Affected by alcohol:  No. (states that he drinks 2 beers per day, records indicate significantly more) Affected by stress: unknown, doesn't know that he shakes Affected by fatigue:  Unknown, doesn't know that he shakes Spills soup if on spoon:  No. Spills glass of liquid if full:  No. Affects ADL's (tying shoes, brushing teeth, etc):  No. (and shaves with a blade still)  Outside reports reviewed: historical medical records and lab reports.  No Known Allergies  Outpatient Encounter Prescriptions as of 02/05/2015  Medication Sig  . amLODipine (NORVASC) 5 MG tablet Take 1 tablet (5 mg total) by mouth daily.  Marland Kitchen aspirin 325 MG tablet Take 325 mg by mouth daily.    Marland Kitchen atorvastatin (LIPITOR) 20 MG tablet Take 1 tablet (20 mg total) by mouth daily.  . folic acid (FOLVITE) 1 MG tablet Take 1 tablet (1 mg total) by mouth daily.  . hydrochlorothiazide (MICROZIDE) 12.5 MG capsule Take 1 capsule (12.5 mg total) by mouth daily.  . Multiple Vitamin (MULTIVITAMIN) tablet Take 1 tablet by mouth daily.  Marland Kitchen thiamine (VITAMIN B-1) 50 MG tablet Take 1 tablet (50 mg total) by mouth daily.    Past Medical History  Diagnosis Date  . Hypertension   . Hypercholesteremia     Past Surgical History  Procedure Laterality Date  . Hernia repair    . Achilles  tendon repair      History   Social History  . Marital Status: Married    Spouse Name: N/A  . Number of Children: N/A  . Years of Education: N/A   Occupational History  . Not on file.   Social History Main Topics  . Smoking status: Current Every Day Smoker -- 0.50 packs/day for 60 years    Types: Cigarettes  . Smokeless tobacco: Not on file  . Alcohol Use: 31.2 oz/week    42 Cans of beer, 10 Shots of liquor per week     Comment: "Couple of beers a week"  . Drug Use: No  . Sexual Activity: Not on file   Other Topics Concern  . Not on file   Social History Narrative    Family Status  Relation Status Death Age  . Mother Deceased     cancer  . Father Deceased     cancer  . Daughter Alive     healthy  . Daughter Alive     healthy    Review of Systems A complete 10 system ROS was obtained and was negative apart from what is mentioned.   Objective:   VITALS:   Filed Vitals:   02/05/15 0915  BP: 138/84  Pulse: 88  Height: 6' (1.829 m)  Weight: 168 lb (76.204 kg)   Gen:  Appears stated age and in NAD. HEENT:  Normocephalic, atraumatic. The mucous membranes are moist. The superficial temporal arteries are without ropiness or tenderness. Cardiovascular: Regular rate and rhythm. Lungs: Clear to auscultation bilaterally. Neck: There are no carotid bruits noted bilaterally.  NEUROLOGICAL:  Orientation:  The patient is alert and oriented x 2.  He states that it is 01/16/2015 and it is 02/05/2015.  He performs the clock drawing well scoring a 4/4.  He is apraxic too many motor commands, however. Cranial nerves: There is good facial symmetry. The pupils are equal round and reactive to light bilaterally. Fundoscopic exam is attempted but the disc margins are not well visualized bilaterally.  Extraocular muscles are intact and visual fields are full to confrontational testing. Speech is fluent and clear. Soft palate rises symmetrically and there is no tongue deviation.  Hearing is intact to conversational tone. Tone: Tone is good throughout. Sensation: Sensation is intact to light touch and pinprick throughout (facial, trunk, extremities). Vibration is intact at the bilateral big toe. There is no extinction with double simultaneous stimulation. There is no sensory dermatomal level identified. Coordination:  The patient has no dysdiadichokinesia or dysmetria. Motor: Strength is 5/5 in the bilateral upper and lower extremities.  Shoulder shrug is equal bilaterally.  There is no pronator drift.  There are no fasciculations noted. DTR's: Deep tendon reflexes are 2/4 at the bilateral biceps, triceps, brachioradialis, patella and 1/4 at the bilateral achilles.  Plantar responses are downgoing bilaterally. Gait and Station: The patient is able to ambulate without difficulty. The patient is unable to ambulate in a tandem fashion.  He has difficulty standing in the Romberg position with eyes closed but is able to do it with eyes open.  MOVEMENT EXAM: Tremor:  There is tremor in the UE, noted most significantly with action.  The patient has some difficulty with Archimedes spirals.  There is no rest tremor.  He does spell water when pouring from one glass to another, but minimally so.  He wrote me a sentence that said "I am at the doctor's office" and it was printed in very large letters.  There was no micrographia.     Assessment/Plan:   1. Tremor.  -I see no evidence of Parkinson's disease and reassured the patient regarding this today.  -I will do some lab work including TSH (appears that it was supposed to be drawn previously but was not collected).  He also appears to be developing some memory loss so at some lab work in that regard as well.  -I agree with his primary care physician that this is likely due to alcohol withdrawal.  I had a long discussion with the patient and his wife regarding the importance of weaning and then discontinuing alcohol.  We talked about  long-term effects of alcohol.  We talked about its effect on memory.  We talked about its effect on balance.  We talked about its effect on tremor.  We talked about its effect on the liver, amongst other things.  We talked about the fact that alcohol can cause brain atrophy that is irreversible.  His wife then stated that she thought that they were here for a "brain scan."  I told her that I did not think that he needed to brain scan as I felt confident that it would show atrophy and small vessel disease.  I saw no indication for an MRI of the brain as his neurological examination was nonfocal and nonlateralizing.  If that changed, then  perhaps we could/should do one but right now I didn't feel that he needed one.  His wife agreed.  -They will follow-up with me on an as-needed basis.  Much greater than 50% of this 60 minute visit in counseling, as above.

## 2015-02-06 ENCOUNTER — Telehealth: Payer: Self-pay | Admitting: Neurology

## 2015-02-06 LAB — FOLATE: Folate: >20 ng/mL

## 2015-02-06 LAB — RPR

## 2015-02-06 LAB — VITAMIN B12: VITAMIN B 12: 550 pg/mL (ref 211–911)

## 2015-02-06 LAB — TSH: TSH: 2.461 u[IU]/mL (ref 0.350–4.500)

## 2015-02-06 NOTE — Telephone Encounter (Signed)
-----   Message from Alda Berthold, DO sent at 02/06/2015  9:01 AM EDT ----- Please notify patient lab are within normal limits.  Thank you.

## 2015-02-06 NOTE — Telephone Encounter (Signed)
Patient made aware labs normal.

## 2015-03-17 ENCOUNTER — Other Ambulatory Visit: Payer: Self-pay | Admitting: Internal Medicine

## 2015-03-22 ENCOUNTER — Other Ambulatory Visit: Payer: Self-pay | Admitting: Internal Medicine

## 2015-04-27 ENCOUNTER — Ambulatory Visit: Payer: Medicare Other | Admitting: Family Medicine

## 2015-05-05 ENCOUNTER — Ambulatory Visit: Payer: Medicare Other | Admitting: Family Medicine

## 2015-05-26 ENCOUNTER — Encounter: Payer: Self-pay | Admitting: Family Medicine

## 2015-05-26 ENCOUNTER — Ambulatory Visit (INDEPENDENT_AMBULATORY_CARE_PROVIDER_SITE_OTHER): Payer: Medicare Other | Admitting: Family Medicine

## 2015-05-26 VITALS — BP 154/83 | HR 117 | Temp 98.2°F | Resp 14 | Ht 72.0 in | Wt 161.0 lb

## 2015-05-26 DIAGNOSIS — E785 Hyperlipidemia, unspecified: Secondary | ICD-10-CM | POA: Diagnosis not present

## 2015-05-26 DIAGNOSIS — I10 Essential (primary) hypertension: Secondary | ICD-10-CM

## 2015-05-26 NOTE — Progress Notes (Signed)
Patient ID: Michael Sharp, male   DOB: [birth year], 80 y.o.   MRN: [id number]   Michael Sharp, is a 80 y.o. male  [id number]  [id number]  DOB - Jan 28, 1935  CC:  Chief Complaint  Patient presents with  . Follow-up  . Hypertension  . Hyperlipidemia       HPI: Michael Sharp is a 79 y.o. male here for follow-up chronic conditions, specifically hypertension and hypercholesterolemia. He is unsure if has had he medication today. He is  On amlodipine 5 mg daily and hctz 12.5.He chart indicates that he should need a refill on his hctz but he says not.  He denies other complaints today.He had lab work in February and I see nothing that needs to be repeated at this time.  No Known Allergies  Past Medical History  Diagnosis Date  . Hypertension   . Hypercholesteremia    Current Outpatient Prescriptions on File Prior to Visit  Medication Sig Dispense Refill  . amLODipine (NORVASC) 5 MG tablet Take 1 tablet (5 mg total) by mouth daily. 90 tablet 3  . aspirin 325 MG tablet Take 325 mg by mouth daily.      Marland Kitchen atorvastatin (LIPITOR) 20 MG tablet Take 1 tablet (20 mg total) by mouth daily. 90 tablet 3  . folic acid (FOLVITE) 1 MG tablet Take 1 tablet (1 mg total) by mouth daily. 30 tablet 11  . hydrochlorothiazide (MICROZIDE) 12.5 MG capsule TAKE 1 CAPSULE (12.5 MG TOTAL) BY MOUTH DAILY. 30 capsule 1  . hydrochlorothiazide (MICROZIDE) 12.5 MG capsule TAKE 1 CAPSULE (12.5 MG TOTAL) BY MOUTH DAILY. 30 capsule 1  . Multiple Vitamin (MULTIVITAMIN) tablet Take 1 tablet by mouth daily.    Marland Kitchen thiamine (VITAMIN B-1) 50 MG tablet Take 1 tablet (50 mg total) by mouth daily. 30 tablet 11   No current facility-administered medications on file prior to visit.   Family History  Problem Relation Age of Onset  . Cancer Mother   . Cancer Father    History   Social History  . Marital Status: Married    Spouse Name: N/A  . Number of Children: N/A  . Years of Education: N/A   Occupational  History  . Not on file.   Social History Main Topics  . Smoking status: Current Every Day Smoker -- 0.50 packs/day for 60 years    Types: Cigarettes  . Smokeless tobacco: Not on file  . Alcohol Use: 31.2 oz/week    42 Cans of beer, 10 Shots of liquor per week     Comment: "Couple of beers a week"  . Drug Use: No  . Sexual Activity: Not on file   Other Topics Concern  . Not on file   Social History Narrative    Review of Systems: Constitutional: Negative for fever, chills, appetite change, weight loss,  fatigue. HENT: Negative for ear pain, ear discharge.nose bleeds Eyes: Negative for pain, discharge, redness, itching and visual disturbance. Neck: Negative for pain, stiffness Respiratory: Negative for cough, shortness of breath,   Cardiovascular: Negative for chest pain, palpitations and leg swelling. Gastrointestinal: Negative for abdominal distention, abdominal pain, nausea, vomiting, diarrhea, constipations Genitourinary: Negative for dysuria, urgency, frequency, hematuria, flank pain,  Musculoskeletal: Negative for back pain, joint pain, joint  swelling, arthralgia and gait problem.Negative for weakness. Neurological: Negative for dizziness, tremors, seizures, syncope,   light-headedness, numbness and headaches.  Hematological: Negative for easy bruising or bleeding Psychiatric/Behavioral: Negative for depression, anxiety, decreased concentration, confusion   Objective:  Filed Vitals:   05/26/15 1517  BP: 154/83  Pulse: 117  Temp: 98.2 F (36.8 C)  Resp: 14    Physical Exam: Constitutional: Patient appears well-developed and well-nourished. No distress. HENT: Normocephalic, atraumatic, External right and left ear normal. Oropharynx is clear and moist.  Eyes: Conjunctivae and EOM are normal. PERRLA, no scleral icterus. Neck: Normal ROM. Neck supple. No lymphadenopathy, No thyromegaly. CVS: RRR, S1/S2 +, no murmurs, no gallops, no rubs Pulmonary: Effort and breath  sounds normal, no stridor, rhonchi, wheezes, rales.  Abdominal: Soft. Normoactive BS,, no distension, tenderness, rebound or guarding.  Musculoskeletal: Normal range of motion. No edema and no tenderness.  Neuro: Alert.Normal muscle tone coordination. Non-focal Skin: Skin is warm and dry. No rash noted. Not diaphoretic. No erythema. No pallor. Psychiatric: Normal mood and affect. Behavior, judgment, thought content normal.  Lab Results  Component Value Date   WBC 5.0 12/18/2014   HGB 15.9 12/18/2014   HCT 46.5 12/18/2014   MCV 101.5* 12/18/2014   PLT 180 12/18/2014   Lab Results  Component Value Date   CREATININE 0.98 01/26/2015   BUN 8 01/26/2015   NA 137 01/26/2015   K 4.2 01/26/2015   CL 102 01/26/2015   CO2 23 01/26/2015    No results found for: HGBA1C Lipid Panel     Component Value Date/Time   CHOL 205* 01/19/2015 0919   TRIG 180* 01/19/2015 0919   HDL 48 01/19/2015 0919   CHOLHDL 4.3 01/19/2015 0919   VLDL 36 01/19/2015 0919   LDLCALC 121* 01/19/2015 0919       Assessment and plan:   Hypertension -We will call him tomorrow and have him confirm that he has been taking hctz and whether he needs a refill. -Reminded of low salt diet and regular exercise.  Hyperlipidemia - He is on Atorvastatin 20 and current medications state that there is no need to recheck lipids as it is no recommended to change dosage.  No Follow-up on file.  The patient was given clear instructions to go to ER or return to medical center if symptoms don't improve, worsen or new problems develop. The patient verbalized understanding. The patient was told to call to get lab results if they haven't heard anything in the next week.       Michael Chapman, MSN, FNP-BC   05/26/2015, 4:02 PM

## 2015-05-26 NOTE — Patient Instructions (Signed)
We will call you tomorrow to confirm what medications you are taking for high blood pressure Return one day next week for nurse visit to recheck BP. Make sure you take your blood pressure medications as least an hour before you come.

## 2015-05-27 MED ORDER — HYDROCHLOROTHIAZIDE 12.5 MG PO CAPS: ORAL_CAPSULE | ORAL | Status: DC

## 2015-06-01 ENCOUNTER — Other Ambulatory Visit: Payer: Medicare Other

## 2015-06-01 ENCOUNTER — Other Ambulatory Visit: Payer: Self-pay | Admitting: Family Medicine

## 2015-06-01 MED ORDER — AMLODIPINE BESYLATE 10 MG PO TABS: 10.0000 mg | ORAL_TABLET | Freq: Every day | ORAL | Status: DC

## 2015-06-01 MED ORDER — HYDROCHLOROTHIAZIDE 25 MG PO TABS: 25.0000 mg | ORAL_TABLET | Freq: Every day | ORAL | Status: DC

## 2016-03-21 ENCOUNTER — Encounter: Payer: Self-pay | Admitting: Family Medicine

## 2016-03-21 ENCOUNTER — Ambulatory Visit (INDEPENDENT_AMBULATORY_CARE_PROVIDER_SITE_OTHER): Payer: Medicare Other | Admitting: Family Medicine

## 2016-03-21 ENCOUNTER — Other Ambulatory Visit: Payer: Self-pay | Admitting: Family Medicine

## 2016-03-21 VITALS — BP 152/82 | HR 98 | Temp 97.8°F | Resp 16 | Ht 72.0 in | Wt 165.0 lb

## 2016-03-21 DIAGNOSIS — B351 Tinea unguium: Secondary | ICD-10-CM | POA: Diagnosis not present

## 2016-03-21 DIAGNOSIS — I10 Essential (primary) hypertension: Secondary | ICD-10-CM | POA: Diagnosis not present

## 2016-03-21 DIAGNOSIS — R739 Hyperglycemia, unspecified: Secondary | ICD-10-CM

## 2016-03-21 DIAGNOSIS — E785 Hyperlipidemia, unspecified: Secondary | ICD-10-CM | POA: Diagnosis not present

## 2016-03-21 DIAGNOSIS — F17200 Nicotine dependence, unspecified, uncomplicated: Secondary | ICD-10-CM

## 2016-03-21 DIAGNOSIS — F1020 Alcohol dependence, uncomplicated: Secondary | ICD-10-CM

## 2016-03-21 DIAGNOSIS — R7309 Other abnormal glucose: Secondary | ICD-10-CM | POA: Diagnosis not present

## 2016-03-21 LAB — COMPLETE METABOLIC PANEL WITH GFR
ALK PHOS: 79 U/L (ref 40–115)
ALT: 24 U/L (ref 9–46)
AST: 45 U/L — ABNORMAL HIGH (ref 10–35)
Albumin: 3.9 g/dL (ref 3.6–5.1)
BUN: 12 mg/dL (ref 7–25)
CO2: 23 mmol/L (ref 20–31)
CREATININE: 1.14 mg/dL (ref 0.70–1.18)
Calcium: 9.2 mg/dL (ref 8.6–10.3)
Chloride: 100 mmol/L (ref 98–110)
GFR, EST AFRICAN AMERICAN: 71 mL/min (ref >=60)
GFR, EST NON AFRICAN AMERICAN: 61 mL/min (ref >=60)
Glucose, Bld: 124 mg/dL — ABNORMAL HIGH (ref 65–99)
Potassium: 4.2 mmol/L (ref 3.5–5.3)
Sodium: 136 mmol/L (ref 135–146)
Total Bilirubin: 1 mg/dL (ref 0.2–1.2)
Total Protein: 7.6 g/dL (ref 6.1–8.1)

## 2016-03-21 LAB — LIPID PANEL
Cholesterol: 197 mg/dL (ref 125–200)
HDL: 50 mg/dL (ref >=40)
LDL Cholesterol: 124 mg/dL (ref <130)
TRIGLYCERIDES: 117 mg/dL (ref <150)
Total CHOL/HDL Ratio: 3.9 Ratio (ref <=5.0)
VLDL: 23 mg/dL (ref <30)

## 2016-03-21 LAB — GLUCOSE, CAPILLARY: Glucose-Capillary: 147 mg/dL — ABNORMAL HIGH (ref 65–99)

## 2016-03-21 MED ORDER — VITAMIN B-1 50 MG PO TABS: 50.0000 mg | ORAL_TABLET | Freq: Every day | ORAL | Status: DC

## 2016-03-21 MED ORDER — AMLODIPINE BESYLATE 10 MG PO TABS: 10.0000 mg | ORAL_TABLET | Freq: Every day | ORAL | Status: DC

## 2016-03-21 MED ORDER — ATORVASTATIN CALCIUM 20 MG PO TABS: 20.0000 mg | ORAL_TABLET | Freq: Every day | ORAL | Status: DC

## 2016-03-21 MED ORDER — FOLIC ACID 1 MG PO TABS: 1.0000 mg | ORAL_TABLET | Freq: Every day | ORAL | Status: DC

## 2016-03-21 NOTE — Progress Notes (Signed)
Subjective:    Patient ID: Michael Sharp, male    DOB: [birth year], 81 y.o.   MRN: [id number]  HPI Michael Sharp, a 81 year old male with a history of hypertension and hypercholesteremia presents for a 6 month follow up of chronic conditions.  He is not exercising and is not adherent to low salt diet.  Patient does not check blood pressures at home.   Patient denies chest pain, dyspnea, fatigue, irregular heart beat, palpitations, syncope and tachypnea.  Cardiovascular risk factors include: dyslipidemia, hypertension and smoking/ tobacco exposure.   Michael Sharp also has a history of daily alcohol use. He state that he drinks 2-3 beers every afternoon. He does not feel the need to quit, he denies needing an early morning eye opener and does not feel guilty about using alcohol.  Patient is also an everyday smoker. He says that he has been smoking since he was 81 years old. He has cut down to 0.5 packs per day.   Past Medical History  Diagnosis Date  . Hypertension   . Hypercholesteremia    Social History   Social History  . Marital Status: Married    Spouse Name: N/A  . Number of Children: N/A  . Years of Education: N/A   Occupational History  . Not on file.   Social History Main Topics  . Smoking status: Current Every Day Smoker -- 0.50 packs/day for 60 years    Types: Cigarettes  . Smokeless tobacco: Not on file  . Alcohol Use: 31.2 oz/week    42 Cans of beer, 10 Shots of liquor per week     Comment: "Couple of beers a day" 2 per day   . Drug Use: No  . Sexual Activity: Not on file   Other Topics Concern  . Not on file   Social History Narrative   Immunization History  Administered Date(s) Administered  . Pneumococcal Conjugate-13 12/18/2014  . Tdap 01/26/2015   Review of Systems  Constitutional: Negative.  Negative for fever, fatigue and unexpected weight change.  Eyes: Negative.   Respiratory: Negative.   Cardiovascular: Negative.   Gastrointestinal:  Negative.   Endocrine: Negative.  Negative for polydipsia, polyphagia and polyuria.  Genitourinary: Negative.   Musculoskeletal: Negative.        Occasional right great toe pain  Skin: Negative.   Allergic/Immunologic: Negative.  Negative for immunocompromised state.  Neurological: Positive for tremors.  Hematological: Negative.   Psychiatric/Behavioral: Negative.        Objective:   Physical Exam  Constitutional: He is oriented to person, place, and time. He appears well-developed and well-nourished.  HENT:  Head: Normocephalic and atraumatic.  Right Ear: External ear normal.  Left Ear: External ear normal.  Nose: Nose normal.  Mouth/Throat: Oropharynx is clear and moist.  Eyes: Conjunctivae and EOM are normal. Pupils are equal, round, and reactive to light.  Neck: Normal range of motion.  Cardiovascular: Normal rate, normal heart sounds and intact distal pulses.   Pulmonary/Chest: Effort normal and breath sounds normal.  Abdominal: Soft. Bowel sounds are normal.  Musculoskeletal: Normal range of motion.  Neurological: He is alert and oriented to person, place, and time. He has normal strength and normal reflexes. He displays a negative Romberg sign.  Resting tremor  Skin: Skin is warm and dry.  Bilateral great toenail thickening and yellowing.   Psychiatric: He has a normal mood and affect. His behavior is normal. Judgment and thought content normal.  BP 152/82 mmHg  Pulse 98  Temp(Src) 97.8 F (36.6 C) (Oral)  Resp 16  Ht 6' (1.829 m)  Wt 165 lb (74.844 kg)  BMI 22.37 kg/m2  SpO2 100% Assessment & Plan:  1. Essential hypertension Blood pressure is above goal, patient did not take anti-hypertensive medication prior to arrival. Will return in 1 week for blood pressure check.  - amLODipine (NORVASC) 10 MG tablet; Take 1 tablet (10 mg total) by mouth daily.  Dispense: 90 tablet; Refill: 3 - COMPLETE METABOLIC PANEL WITH GFR - Ambulatory referral to  Ophthalmology  2. Hyperlipidemia LDL goal <100  - atorvastatin (LIPITOR) 20 MG tablet; Take 1 tablet (20 mg total) by mouth daily.  Dispense: 90 tablet; Refill: 3 - Lipid Panel  3. Onychomycosis of toenail - Ambulatory referral to Podiatry  4. Alcoholism ([place]) - thiamine (VITAMIN B-1) 50 MG tablet; Take 1 tablet (50 mg total) by mouth daily.  Dispense: 30 tablet; Refill: 11 - folic acid (FOLVITE) 1 MG tablet; Take 1 tablet (1 mg total) by mouth daily.  Dispense: 30 tablet; Refill: 11  5. Hyperglycemia  - Glucose, capillary - Hemoglobin A1c  6. Tobacco use disorder Smoking cessation instruction/counseling given:  counseled patient on the dangers of tobacco use, advised patient to stop smoking, and reviewed strategies to maximize success    RTC: 3 months for chronic conditions. 1 week for blood pressure check

## 2016-03-22 LAB — HEMOGLOBIN A1C
Hgb A1c MFr Bld: 5.3 % (ref <5.7)
Mean Plasma Glucose: 105 mg/dL

## 2016-03-24 LAB — HEPATIC FUNCTION PANEL
ALK PHOS: 83 U/L (ref 40–115)
ALT: 21 U/L (ref 9–46)
AST: 40 U/L — AB (ref 10–35)
Albumin: 3.8 g/dL (ref 3.6–5.1)
BILIRUBIN DIRECT: 0.2 mg/dL (ref <=0.2)
BILIRUBIN INDIRECT: 0.6 mg/dL (ref 0.2–1.2)
Total Bilirubin: 0.8 mg/dL (ref 0.2–1.2)
Total Protein: 7.5 g/dL (ref 6.1–8.1)

## 2016-04-04 ENCOUNTER — Ambulatory Visit: Payer: Medicare Other

## 2016-04-04 ENCOUNTER — Other Ambulatory Visit: Payer: Self-pay | Admitting: Family Medicine

## 2016-04-04 VITALS — BP 148/76

## 2016-04-04 DIAGNOSIS — I10 Essential (primary) hypertension: Secondary | ICD-10-CM

## 2016-04-04 LAB — POCT URINALYSIS DIP (DEVICE)
Bilirubin Urine: NEGATIVE
Glucose, UA: 250 mg/dL — AB
HGB URINE DIPSTICK: NEGATIVE
Ketones, ur: NEGATIVE mg/dL
Leukocytes, UA: NEGATIVE
NITRITE: NEGATIVE
PH: 6 (ref 5.0–8.0)
PROTEIN: NEGATIVE mg/dL
Specific Gravity, Urine: 1.01 (ref 1.005–1.030)
Urobilinogen, UA: 1 mg/dL (ref 0.0–1.0)

## 2016-04-12 DIAGNOSIS — M2041 Other hammer toe(s) (acquired), right foot: Secondary | ICD-10-CM | POA: Diagnosis not present

## 2016-04-12 DIAGNOSIS — D2371 Other benign neoplasm of skin of right lower limb, including hip: Secondary | ICD-10-CM | POA: Diagnosis not present

## 2016-06-09 ENCOUNTER — Emergency Department (HOSPITAL_COMMUNITY)
Admission: EM | Admit: 2016-06-09 | Discharge: 2016-06-10 | Disposition: A | Discharge status: Other Acute Inpatient Hospital | Payer: Medicare Other | Attending: Emergency Medicine | Admitting: Emergency Medicine

## 2016-06-09 ENCOUNTER — Encounter (HOSPITAL_COMMUNITY): Payer: Self-pay | Admitting: *Deleted

## 2016-06-09 DIAGNOSIS — F17210 Nicotine dependence, cigarettes, uncomplicated: Secondary | ICD-10-CM | POA: Insufficient documentation

## 2016-06-09 DIAGNOSIS — Z046 Encounter for general psychiatric examination, requested by authority: Secondary | ICD-10-CM | POA: Diagnosis present

## 2016-06-09 DIAGNOSIS — Z01818 Encounter for other preprocedural examination: Secondary | ICD-10-CM | POA: Diagnosis not present

## 2016-06-09 DIAGNOSIS — Z79899 Other long term (current) drug therapy: Secondary | ICD-10-CM | POA: Insufficient documentation

## 2016-06-09 DIAGNOSIS — I10 Essential (primary) hypertension: Secondary | ICD-10-CM | POA: Diagnosis not present

## 2016-06-09 DIAGNOSIS — F1094 Alcohol use, unspecified with alcohol-induced mood disorder: Secondary | ICD-10-CM

## 2016-06-09 DIAGNOSIS — F1024 Alcohol dependence with alcohol-induced mood disorder: Secondary | ICD-10-CM | POA: Diagnosis not present

## 2016-06-09 DIAGNOSIS — F1014 Alcohol abuse with alcohol-induced mood disorder: Secondary | ICD-10-CM | POA: Insufficient documentation

## 2016-06-09 DIAGNOSIS — R45850 Homicidal ideations: Secondary | ICD-10-CM | POA: Diagnosis not present

## 2016-06-09 DIAGNOSIS — Z049 Encounter for examination and observation for unspecified reason: Secondary | ICD-10-CM

## 2016-06-09 LAB — RAPID URINE DRUG SCREEN, HOSP PERFORMED
AMPHETAMINES: NOT DETECTED
BARBITURATES: NOT DETECTED
BENZODIAZEPINES: NOT DETECTED
COCAINE: NOT DETECTED
Opiates: NOT DETECTED
Tetrahydrocannabinol: NOT DETECTED

## 2016-06-09 LAB — CBC
HEMATOCRIT: 40.5 % (ref 39.0–52.0)
Hemoglobin: 14 g/dL (ref 13.0–17.0)
MCH: 35.8 pg — ABNORMAL HIGH (ref 26.0–34.0)
MCHC: 34.6 g/dL (ref 30.0–36.0)
MCV: 103.6 fL — AB (ref 78.0–100.0)
PLATELETS: 181 10*3/uL (ref 150–400)
RBC: 3.91 MIL/uL — AB (ref 4.22–5.81)
RDW: 14.8 % (ref 11.5–15.5)
WBC: 5 10*3/uL (ref 4.0–10.5)

## 2016-06-09 LAB — COMPREHENSIVE METABOLIC PANEL
ALBUMIN: 3.7 g/dL (ref 3.5–5.0)
ALT: 26 U/L (ref 17–63)
AST: 48 U/L — AB (ref 15–41)
Alkaline Phosphatase: 78 U/L (ref 38–126)
Anion gap: 10 (ref 5–15)
BUN: 9 mg/dL (ref 6–20)
CHLORIDE: 106 mmol/L (ref 101–111)
CO2: 24 mmol/L (ref 22–32)
Calcium: 8.9 mg/dL (ref 8.9–10.3)
Creatinine, Ser: 0.94 mg/dL (ref 0.61–1.24)
GFR calc Af Amer: >60 mL/min (ref >60)
GFR calc non Af Amer: >60 mL/min (ref >60)
GLUCOSE: 88 mg/dL (ref 65–99)
POTASSIUM: 3.5 mmol/L (ref 3.5–5.1)
SODIUM: 140 mmol/L (ref 135–145)
Total Bilirubin: 1 mg/dL (ref 0.3–1.2)
Total Protein: 7.4 g/dL (ref 6.5–8.1)

## 2016-06-09 LAB — ACETAMINOPHEN LEVEL: Acetaminophen (Tylenol), Serum: <10 ug/mL — ABNORMAL LOW (ref 10–30)

## 2016-06-09 LAB — SALICYLATE LEVEL: Salicylate Lvl: <4 mg/dL (ref 2.8–30.0)

## 2016-06-09 LAB — ETHANOL: Alcohol, Ethyl (B): 174 mg/dL — ABNORMAL HIGH (ref <5)

## 2016-06-09 MED ORDER — LORAZEPAM 1 MG PO TABS
0.0000 mg | ORAL_TABLET | Freq: Two times a day (BID) | ORAL | Status: DC
Start: 2016-06-12 — End: 2016-06-10

## 2016-06-09 MED ORDER — LORAZEPAM 1 MG PO TABS: 0.0000 mg | ORAL_TABLET | Freq: Four times a day (QID) | ORAL | Status: DC

## 2016-06-09 MED ORDER — NICOTINE 21 MG/24HR TD PT24: 21.0000 mg | MEDICATED_PATCH | Freq: Every day | TRANSDERMAL | Status: DC

## 2016-06-09 MED ORDER — ACETAMINOPHEN 325 MG PO TABS: 650.0000 mg | ORAL_TABLET | ORAL | Status: DC | PRN
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
## 2016-06-09 NOTE — ED Notes (Signed)
Patient and belongings wanded by security.  
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
## 2016-06-10 ENCOUNTER — Emergency Department (HOSPITAL_COMMUNITY): Payer: Medicare Other

## 2016-06-10 DIAGNOSIS — F1094 Alcohol use, unspecified with alcohol-induced mood disorder: Secondary | ICD-10-CM | POA: Insufficient documentation

## 2016-06-10 DIAGNOSIS — R45850 Homicidal ideations: Secondary | ICD-10-CM

## 2016-06-10 DIAGNOSIS — F1024 Alcohol dependence with alcohol-induced mood disorder: Secondary | ICD-10-CM | POA: Diagnosis not present

## 2016-06-10 DIAGNOSIS — F1014 Alcohol abuse with alcohol-induced mood disorder: Secondary | ICD-10-CM | POA: Diagnosis not present

## 2016-06-10 DIAGNOSIS — Z01818 Encounter for other preprocedural examination: Secondary | ICD-10-CM | POA: Diagnosis not present

## 2016-06-10 MED ORDER — AMLODIPINE BESYLATE 10 MG PO TABS
10.0000 mg | ORAL_TABLET | Freq: Every day | ORAL | Status: DC
  Administered 2016-06-10: 10 mg via ORAL
  Filled 2016-06-10: qty 1

## 2016-06-10 MED ORDER — VITAMIN B-1 100 MG PO TABS
100.0000 mg | ORAL_TABLET | Freq: Every day | ORAL | Status: DC
  Administered 2016-06-10: 100 mg via ORAL
  Filled 2016-06-10: qty 1

## 2016-06-10 MED ORDER — LORAZEPAM 1 MG PO TABS
1.0000 mg | ORAL_TABLET | Freq: Four times a day (QID) | ORAL | Status: DC | PRN
Start: 2016-06-10 — End: 2016-06-10
  Filled 2016-06-10: qty 1

## 2016-06-10 MED ORDER — ADULT MULTIVITAMIN W/MINERALS CH
1.0000 | ORAL_TABLET | Freq: Every day | ORAL | Status: DC
  Administered 2016-06-10: 1 via ORAL
  Filled 2016-06-10: qty 1

## 2016-06-10 MED ORDER — LORAZEPAM 1 MG PO TABS
0.0000 mg | ORAL_TABLET | Freq: Four times a day (QID) | ORAL | Status: DC
Start: 2016-06-10 — End: 2016-06-10
  Administered 2016-06-10: 1 mg via ORAL
  Filled 2016-06-10: qty 1

## 2016-06-10 MED ORDER — FOLIC ACID 1 MG PO TABS
1.0000 mg | ORAL_TABLET | Freq: Every day | ORAL | Status: DC
  Administered 2016-06-10: 1 mg via ORAL
  Filled 2016-06-10: qty 1

## 2016-06-10 MED ORDER — ATORVASTATIN CALCIUM 20 MG PO TABS
20.0000 mg | ORAL_TABLET | Freq: Every day | ORAL | Status: DC
  Administered 2016-06-10: 20 mg via ORAL
  Filled 2016-06-10: qty 1

## 2016-06-10 MED ORDER — LORAZEPAM 2 MG/ML IJ SOLN: 1.0000 mg | Freq: Four times a day (QID) | INTRAMUSCULAR | Status: DC | PRN

## 2016-06-10 MED ORDER — THIAMINE HCL 100 MG/ML IJ SOLN: 100.0000 mg | Freq: Every day | INTRAMUSCULAR | Status: DC

## 2016-06-10 MED ORDER — LORAZEPAM 1 MG PO TABS: 0.0000 mg | ORAL_TABLET | Freq: Two times a day (BID) | ORAL | Status: DC
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
## 2016-06-10 NOTE — BH Assessment (Signed)
Assessment completed. Consulted with Serena Colonel, FNP who recommends patient be observed overnight and evaluated by psychiatry in the morning.   Rosalin Hawking, LCSW Therapeutic Triage Specialist [place] 06/10/2016 12:26 AM

## 2016-06-10 NOTE — ED Notes (Signed)
Pt has stayed in bed all day. He has voiced no complaints of withdrawal symptoms, but has visible tremors. Denies SI/HI. He shared that his first career was with the [employer], and he retired after 23 years. Then, he worked for [employer] for another 20-some years. He has two daughters who have graduated college and are doing well. He has been cooperative. He ate lunch.

## 2016-06-10 NOTE — Progress Notes (Signed)
06/10/16  1347:  LRT went to meet with pt but pt was asleep.   Victorino Sparrow, LRT/CTRS

## 2016-06-10 NOTE — BH Assessment (Signed)
[facility] Assessment Progress Note  Per Ambrose Finland, MD, this pt requires psychiatric hospitalization at this time.  Pt presents under IVC initiated by his wife, which Dr Louretta Shorten has upheld.  At 12:05 Lattie Haw calls from [facility].  Pt has been accepted to their facility by Dr Eugenio Hoes to [place], DNP, concurs with this decision.  Pt's nurse, Diane, has been notified, and agrees to call report to [phone].  Pt is to be transported via [facility].  Jalene Mullet, [place] Triage Specialist [phone]

## 2016-06-10 NOTE — ED Notes (Signed)
Pt discharged to custody of Sheriff to take pt to [facility]. All belongings returned to pt who signed for same.

## 2016-06-27 ENCOUNTER — Encounter (HOSPITAL_COMMUNITY): Payer: Self-pay | Admitting: Licensed Clinical Social Worker

## 2016-06-27 ENCOUNTER — Ambulatory Visit (HOSPITAL_COMMUNITY): Payer: Medicare Other | Admitting: Licensed Clinical Social Worker

## 2016-06-27 DIAGNOSIS — F1020 Alcohol dependence, uncomplicated: Secondary | ICD-10-CM

## 2016-06-27 NOTE — Progress Notes (Signed)
Patient ID: Michael Sharp, male   DOB: [birth year], 81 y.o.   MRN: [id number]   Met with pt today. He was just released from [facility] after being IVC by wife. His wife brought him in and asked to speak with me. She came in with the pt. She reports he drinks daily. Pt said he drinks a can of beer almost daily. Spoke with pt and his wife about CD-IOP program. Will have Wes call them back to make appt for orientation to program.  Jenkins Rouge, LCAS-A

## 2016-07-21 ENCOUNTER — Telehealth: Payer: Self-pay

## 2016-07-21 DIAGNOSIS — F1020 Alcohol dependence, uncomplicated: Secondary | ICD-10-CM

## 2016-07-21 DIAGNOSIS — I10 Essential (primary) hypertension: Secondary | ICD-10-CM

## 2016-07-21 DIAGNOSIS — E785 Hyperlipidemia, unspecified: Secondary | ICD-10-CM

## 2016-07-21 MED ORDER — ATORVASTATIN CALCIUM 20 MG PO TABS: 20.0000 mg | ORAL_TABLET | Freq: Every day | ORAL | 3 refills | Status: DC

## 2016-07-21 MED ORDER — AMLODIPINE BESYLATE 10 MG PO TABS: 10.0000 mg | ORAL_TABLET | Freq: Every day | ORAL | 3 refills | Status: DC

## 2016-07-21 MED ORDER — FOLIC ACID 1 MG PO TABS: 1.0000 mg | ORAL_TABLET | Freq: Every day | ORAL | 11 refills | Status: DC

## 2016-07-21 MED ORDER — HYDROCHLOROTHIAZIDE 25 MG PO TABS: 25.0000 mg | ORAL_TABLET | Freq: Every day | ORAL | 3 refills | Status: DC

## 2016-07-21 NOTE — Telephone Encounter (Signed)
Refills sent into pharmacy. Thanks!  

## 2016-07-25 ENCOUNTER — Encounter: Payer: Self-pay | Admitting: Family Medicine

## 2016-07-26 ENCOUNTER — Other Ambulatory Visit: Payer: Self-pay

## 2016-07-26 DIAGNOSIS — F1020 Alcohol dependence, uncomplicated: Secondary | ICD-10-CM

## 2016-07-26 DIAGNOSIS — I10 Essential (primary) hypertension: Secondary | ICD-10-CM

## 2016-07-26 DIAGNOSIS — E785 Hyperlipidemia, unspecified: Secondary | ICD-10-CM

## 2016-07-26 MED ORDER — HYDROCHLOROTHIAZIDE 25 MG PO TABS
25.0000 mg | ORAL_TABLET | Freq: Every day | ORAL | 3 refills | Status: DC
Start: 2016-07-26 — End: 2016-10-04

## 2016-07-26 MED ORDER — ATORVASTATIN CALCIUM 20 MG PO TABS: 20.0000 mg | ORAL_TABLET | Freq: Every day | ORAL | 3 refills | Status: DC

## 2016-07-26 MED ORDER — FOLIC ACID 1 MG PO TABS: 1.0000 mg | ORAL_TABLET | Freq: Every day | ORAL | 11 refills | Status: DC

## 2016-07-26 MED ORDER — VITAMIN B-1 50 MG PO TABS: 50.0000 mg | ORAL_TABLET | Freq: Every day | ORAL | 11 refills | Status: DC

## 2016-07-26 MED ORDER — AMLODIPINE BESYLATE 10 MG PO TABS: 10.0000 mg | ORAL_TABLET | Freq: Every day | ORAL | 3 refills | Status: DC

## 2016-07-26 NOTE — Telephone Encounter (Signed)
Refills have been sent to [facility]. Thanks!

## 2016-08-18 IMAGING — MR MR ABDOMEN WO/W CM
10 of 19 series · 21 of 48 positions shown · IV contrast (multihance)
Comparison: CT on 12/29/2014

CLINICAL DATA: Abdominal pain for 1 month. Question pancreatic mass
on recent CT.

EXAM:
MRI ABDOMEN WITHOUT AND WITH CONTRAST
TECHNIQUE: Multiplanar multisequence MR imaging of the abdomen was performed
both before and after the administration of intravenous contrast.
CONTRAST:  16mL MULTIHANCE GADOBENATE DIMEGLUMINE 529 MG/ML IV SOLN

[Series 3: T2 fat-sat · axial · 5.0mm · 0.78mm/px · 1 of 49 slices shown]
[im 1/49]
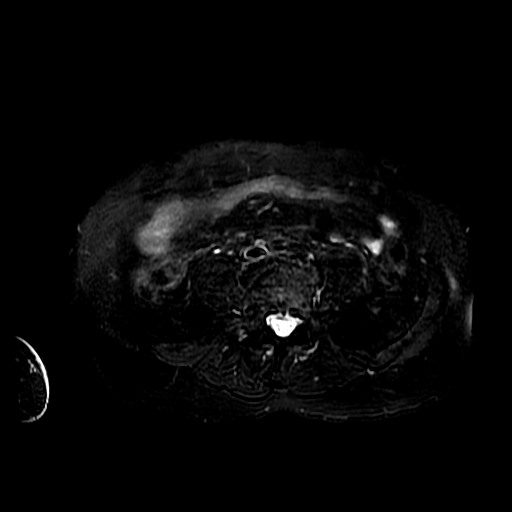

[Series 4: DWI b500 · axial · 6.0mm · 1.48mm/px · z∈[-143,+138]mm · 2 of 73 slices shown]
[im 1/73]
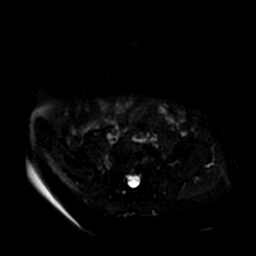
[im 73/73]
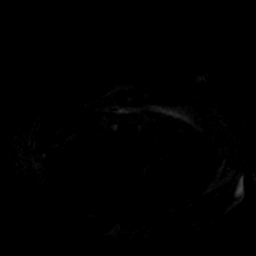

[Series 7: repeat ax dualecho · axial · 5.0mm · 0.78mm/px · z∈[-109,+116]mm · 2 of 92 slices shown]
[im 1/92]
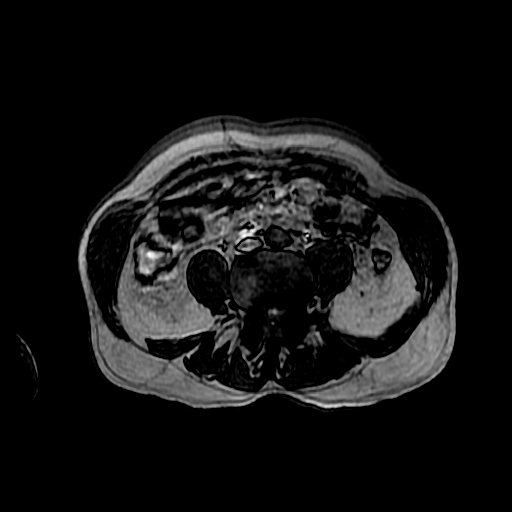
[im 92/92]
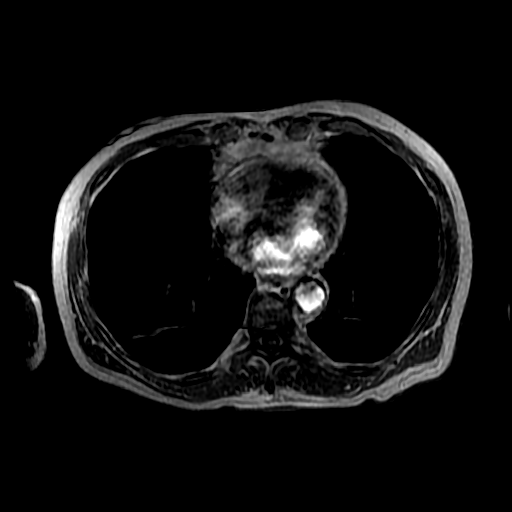

[Series 8: T2 · axial · 5.0mm · 0.78mm/px · 1 of 46 slices shown (1 of 2)]
[im 1/46]
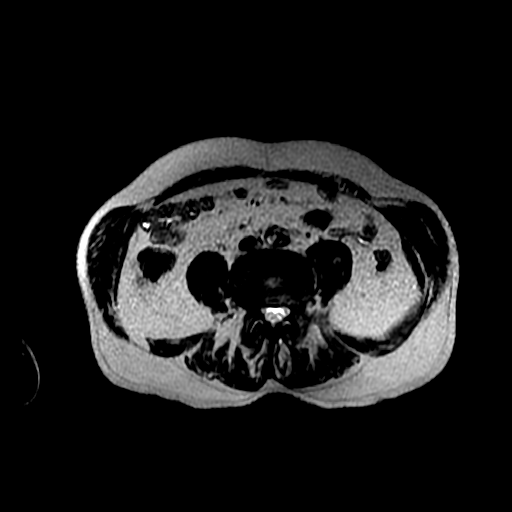

[Series 9: bSSFP · coronal · 5.0mm · 0.78mm/px · 2 of 43 slices shown]
[im 1/43]
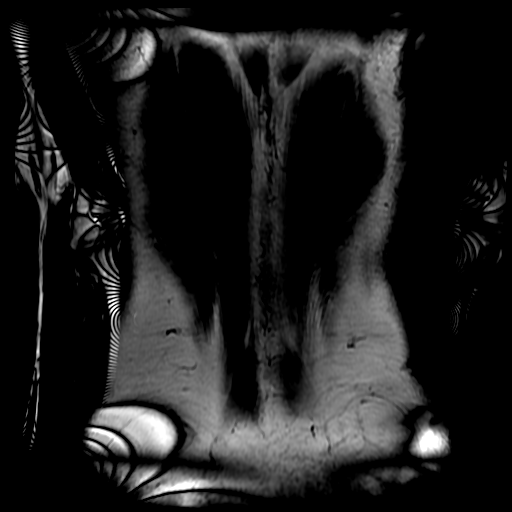
[im 43/43]
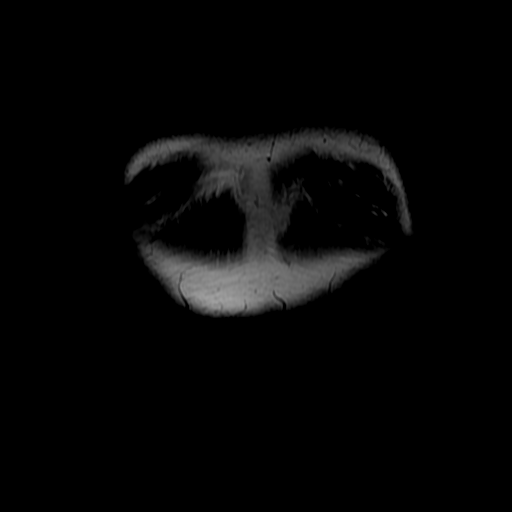

[Series 10: T2 · coronal · 5.0mm · 0.78mm/px · 2 of 43 slices shown (2 of 2)]
[im 1/43]
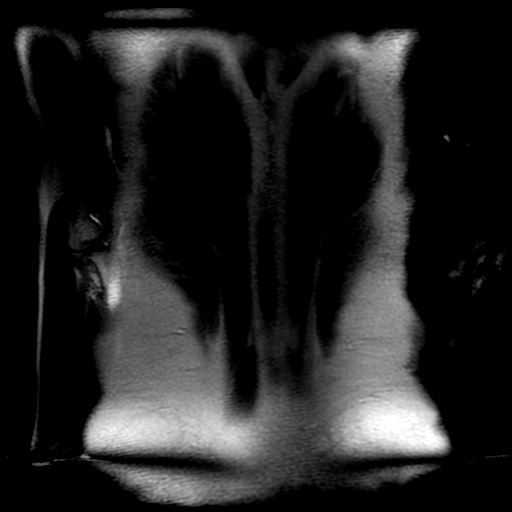
[im 43/43]
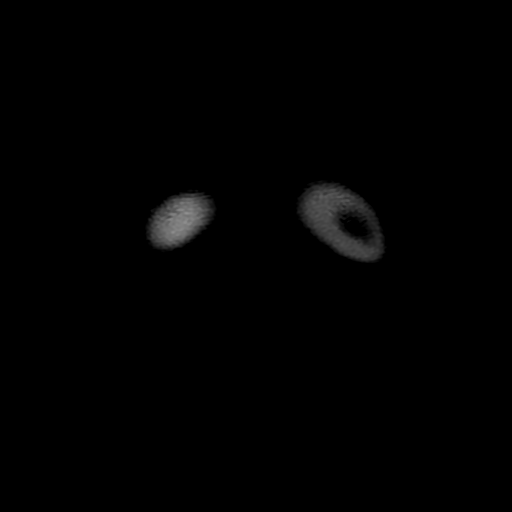

[Series 14: T1 dynamic · axial · 6.0mm · 0.78mm/px · z∈[-99,+114]mm · 3 of 72 slices shown]
[im 1/72]
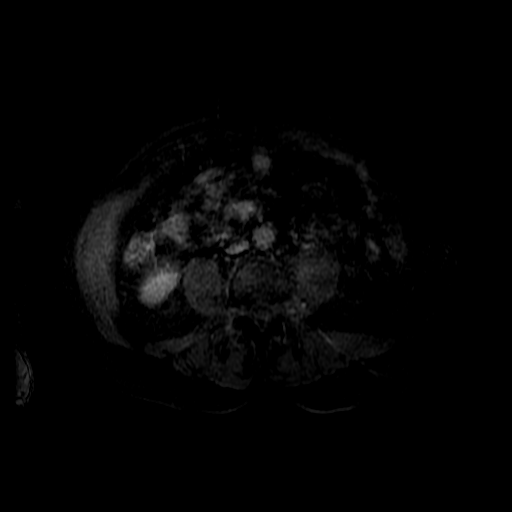
[im 36/72]
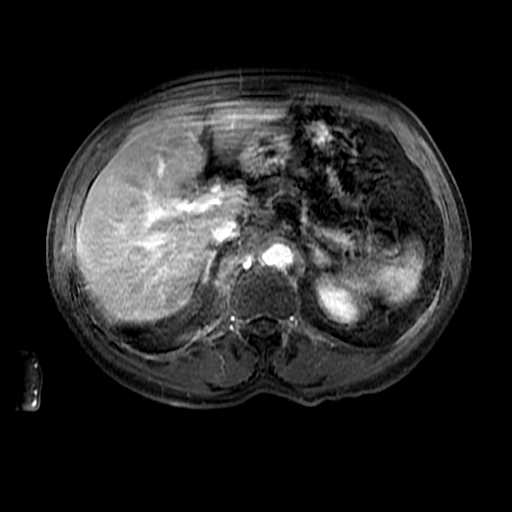
[im 72/72]
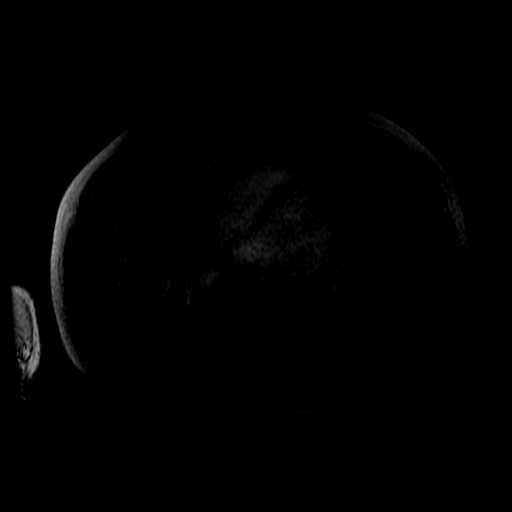

[Series 100: ((id)/(date)..72)-((id)/(id)/1..72) · axial · 6.0mm · 0.78mm/px · z∈[-99,+114]mm · 3 of 72 slices shown]
[im 1/72]
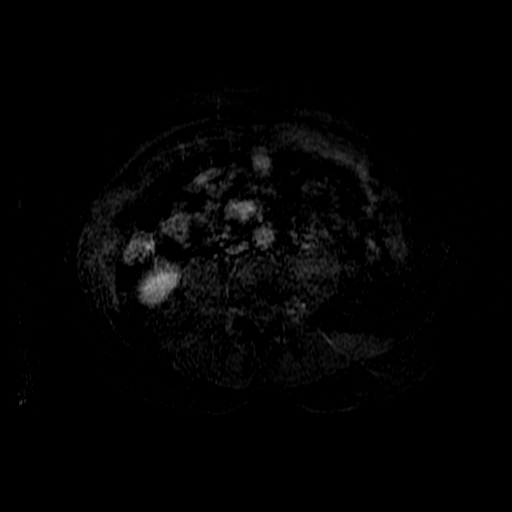
[im 36/72]
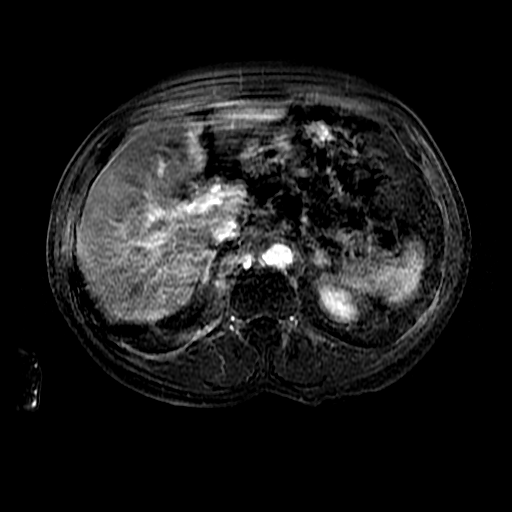
[im 72/72]
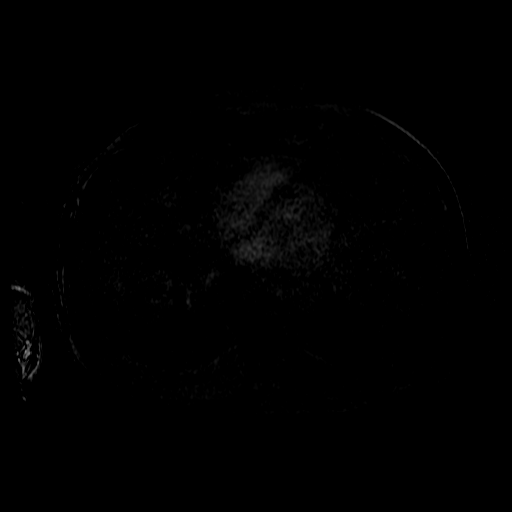

[Series 400: DWI · axial · 6.0mm · 1.48mm/px · 1 of 37 slices shown]
[im 1/37]
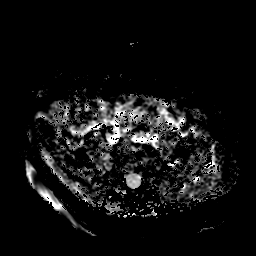

[Series 500: reformatted · axial · 1.6mm · 0.62mm/px · z∈[-15,+115]mm · 4 of 111 slices shown]
[im 1/111]
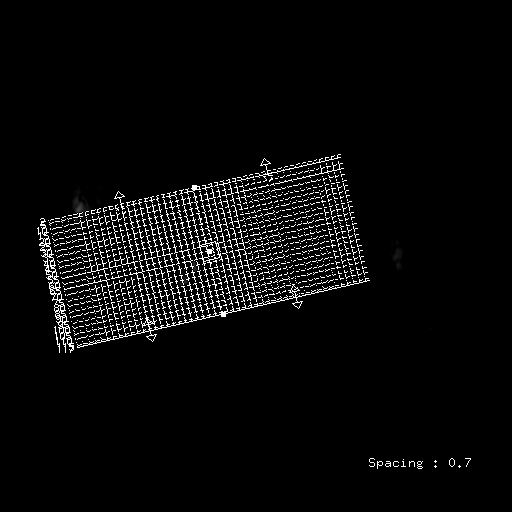
[im 37/111]
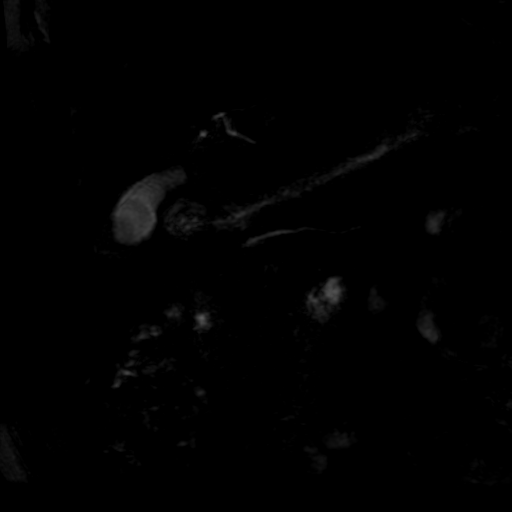
[im 74/111]
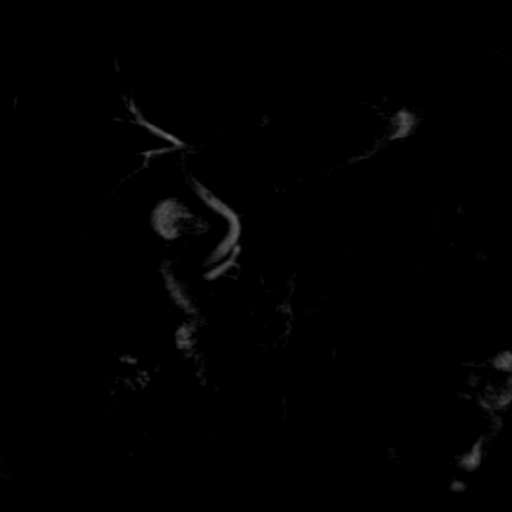
[im 111/111]
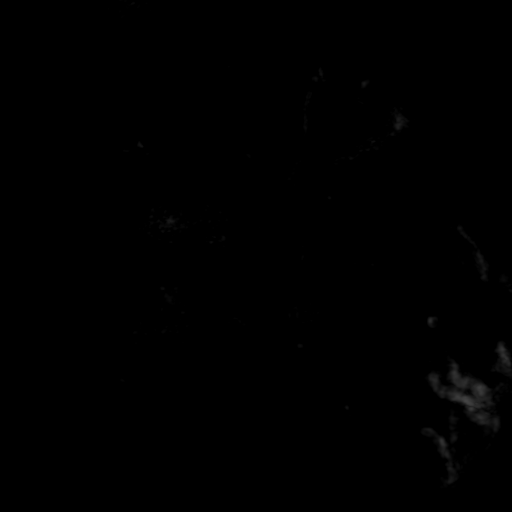

[21 of 48 positions shown; findings below may reference images not displayed]

FINDINGS: Lower chest:  Unremarkable.

Hepatobiliary: Diffuse hepatic steatosis is demonstrated.
Respiratory motion artifact noted, however no liver masses are
identified. Gallbladder is unremarkable. No evidence of biliary
ductal dilatation.

Pancreas: Some respiratory motion artifact noted. Mild contour
irregularity along the pancreatic head is isointense with normal
pancreatic parenchyma. No pancreatic mass visualized. No evidence of
pancreatic ductal dilatation. No evidence of peripancreatic
inflammatory changes or fluid collections. No masses, inflammatory
changes, or fluid collections demonstrated.

Spleen:  Within normal limits in size and appearance.

Adrenal Glands:  No masses identified.

Kidneys: No renal masses identified. No evidence of hydronephrosis.
Small benign left renal cysts noted.

Stomach/Bowel/Peritoneum: No evidence of wall thickening, mass, or
obstruction involving visualized abdominal bowel.

Vascular/Lymphatic: No pathologically enlarged lymph nodes
identified. No other significant abnormality noted.

Other:  None.

Musculoskeletal:  No suspicious bone lesions identified.
IMPRESSION: Some respiratory motion artifact noted, however no definite
pancreatic mass identified. Recommend continued followup by abdomen
CT with contrast in 6 months for confirmation.

Diffuse hepatic steatosis.

## 2016-08-18 NOTE — Progress Notes (Signed)
Subjective:   Michael Sharp was seen in consultation in the movement disorder clinic at the request of Dorena Dew, FNP.  The evaluation is for tremor.  The records that were made available to me were reviewed.  The patient is a 81 y.o. right handed male with a history of tremor.  Pt states "personally I didn't even know that I had the shakes."  He states that his wife is in the lobby but he won't let her back in the examination room initally but she came back on her own when I was through with the history and already performing the examination.  There is no family hx of tremor.    Affected by caffeine:  No. (2 "normal" cups of coffee per day) Affected by alcohol:  No. (states that he drinks 2 beers per day, records indicate significantly more) Affected by stress: unknown, doesn't know that he shakes Affected by fatigue:  Unknown, doesn't know that he shakes Spills soup if on spoon:  No. Spills glass of liquid if full:  No. Affects ADL's (tying shoes, brushing teeth, etc):  No. (and shaves with a blade still)  08/22/16 update:  The patient was seen today, accompanied by his wife who supplements the history.  I have not seen the patient in over a year and a half.  At that time, I thought that he had tremor related to alcohol.  I have reviewed records since last visit.  He was an IVC at the hospital in August, 2017 due to alcoholism and being a threat to his wife.  They bring legal paperwork today stating that the patient has been declared incompetent to make financial decisions.  In regards to personal hygiene, it states that he lacks capacity.  He lacks capacity in regards to health care.  He lacks capacity in regards to personal safety.  His pain work indicates that he has capacity in regards to language and communication, nutrition, and maintaining safe shelter.  There is some confusion as to why they are here.  They state they are here because of the "court."  They are unable to tell me  what they court needs.  The patient and wife are living together.  Pt is still drinking.  He calls a cab to buy the alcohol.  Wife won't buy it.  Wife does finances.  Wife puts meds in pill box and "sometimes he takes them and sometimes he doesn't."  Wife is unable to stay with him 24/7.    Outside reports reviewed: historical medical records and lab reports.  No Known Allergies  Outpatient Encounter Prescriptions as of 08/22/2016  Medication Sig  . amLODipine (NORVASC) 10 MG tablet Take 1 tablet (10 mg total) by mouth daily.  Marland Kitchen atorvastatin (LIPITOR) 20 MG tablet Take 1 tablet (20 mg total) by mouth daily.  . folic acid (FOLVITE) 1 MG tablet Take 1 tablet (1 mg total) by mouth daily.  . hydrochlorothiazide (HYDRODIURIL) 25 MG tablet Take 1 tablet (25 mg total) by mouth daily.  . [DISCONTINUED] Multiple Vitamin (MULTIVITAMIN) tablet Take 1 tablet by mouth daily.  . [DISCONTINUED] thiamine (VITAMIN B-1) 50 MG tablet Take 1 tablet (50 mg total) by mouth daily.   No facility-administered encounter medications on file as of 08/22/2016.     Past Medical History:  Diagnosis Date  . Hypercholesteremia   . Hypertension     Past Surgical History:  Procedure Laterality Date  . ACHILLES TENDON REPAIR    . HERNIA REPAIR  Social History   Social History  . Marital status: Married    Spouse name: N/A  . Number of children: N/A  . Years of education: N/A   Occupational History  . Not on file.   Social History Main Topics  . Smoking status: Current Every Day Smoker    Packs/day: 0.50    Years: 60.00    Types: Cigarettes  . Smokeless tobacco: Not on file  . Alcohol use 31.2 oz/week    42 Cans of beer, 10 Shots of liquor per week     Comment: "Couple of beers a day" 2 per day   . Drug use: No  . Sexual activity: Not on file   Other Topics Concern  . Not on file   Social History Narrative  . No narrative on file    Family Status  Relation Status  . Mother Deceased    cancer  . Father Deceased   cancer  . Daughter Alive   healthy  . Daughter Alive   healthy    Review of Systems A complete 10 system ROS was obtained and was negative apart from what is mentioned.   Objective:   VITALS:   Vitals:   08/22/16 0825  BP: 130/80  Pulse: 95  Weight: 156 lb (70.8 kg)  Height: 6' (1.829 m)   Gen:  Appears stated age and in NAD. HEENT:  Normocephalic, atraumatic. The mucous membranes are moist. The superficial temporal arteries are without ropiness or tenderness. Cardiovascular: Regular rate and rhythm. Lungs: Clear to auscultation bilaterally. Neck: There are no carotid bruits noted bilaterally.  NEUROLOGICAL:  Orientation:   [place] Cognitive Assessment  08/22/2016  Visuospatial/ Executive (0/5) 3  Naming (0/3) 1  Attention: Read list of digits (0/2) 2  Attention: Read list of letters (0/1) 0  Attention: Serial 7 subtraction starting at 100 (0/3) 1  Language: Repeat phrase (0/2) 1  Language : Fluency (0/1) 0  Abstraction (0/2) 0  Delayed Recall (0/5) 0  Orientation (0/6) 4  Total 12  Adjusted Score (based on education) 12   Cranial nerves: There is good facial symmetry. The pupils are equal round and reactive to light bilaterally. Fundoscopic exam is attempted but the disc margins are not well visualized bilaterally.  Extraocular muscles are intact and visual fields are full to confrontational testing. Speech is fluent and clear. Soft palate rises symmetrically and there is no tongue deviation. Hearing is intact to conversational tone. Tone: Tone is good throughout. Sensation: Sensation is intact to light touch and pinprick throughout (facial, trunk, extremities). Vibration is intact at the bilateral ankle. There is no extinction with double simultaneous stimulation. There is no sensory dermatomal level identified. Coordination:  The patient has no dysdiadichokinesia or dysmetria. Motor: Strength is 5/5 in the bilateral upper and lower  extremities.  Shoulder shrug is equal bilaterally.  There is no pronator drift.  There are no fasciculations noted. Gait and Station: The patient is able to ambulate without difficulty. The patient is unable to ambulate in a tandem fashion.  He has difficulty standing in the Romberg position with eyes closed but is able to do it with eyes open.  MOVEMENT EXAM: Tremor:  There is no tremor today.  No asterixis.  No myoclonus  LABS  Lab Results  Component Value Date   VITAMINB12 550 02/05/2015   Lab Results  Component Value Date   TSH 2.461 02/05/2015     Chemistry      Component Value Date/Time  NA 140 06/09/2016 1854   K 3.5 06/09/2016 1854   CL 106 06/09/2016 1854   CO2 24 06/09/2016 1854   BUN 9 06/09/2016 1854   CREATININE 0.94 06/09/2016 1854   CREATININE 1.14 03/21/2016 1138      Component Value Date/Time   CALCIUM 8.9 06/09/2016 1854   ALKPHOS 78 06/09/2016 1854   AST 48 (H) 06/09/2016 1854   ALT 26 06/09/2016 1854   BILITOT 1.0 06/09/2016 1854          Assessment/Plan:   1.   Alcoholism with alcohol related dementia  -pt needs 24 hour per day care and discussed this in detail with patient and wife.  Wife doesn't think that she can provide this.  Pt is active alcoholic.  Talked again about importance of weaning and ultimately d/c EtOH in safe environment.  Talked about fact that even if this is done, memory change is likely permanent.    -pt needs labs and will check TSH, B12, B1, folate, RPR, SPEP/UPEP,   -MRI brain.  Talked to patient and wife about fact that we will likely see atrophy and WMD  -pt has already had competancy eval and has been declared incompetant for finances/healthcare 2.  F/u after above completed.  Much greater than 50% of this visit was spent in counseling and coordinating care.  Total face to face time:  40 min

## 2016-08-22 ENCOUNTER — Other Ambulatory Visit: Payer: Medicare Other

## 2016-08-22 ENCOUNTER — Ambulatory Visit (INDEPENDENT_AMBULATORY_CARE_PROVIDER_SITE_OTHER): Payer: Medicare Other | Admitting: Neurology

## 2016-08-22 ENCOUNTER — Encounter: Payer: Self-pay | Admitting: Neurology

## 2016-08-22 VITALS — BP 130/80 | HR 95 | Ht 72.0 in | Wt 156.0 lb

## 2016-08-22 DIAGNOSIS — F1027 Alcohol dependence with alcohol-induced persisting dementia: Secondary | ICD-10-CM

## 2016-08-22 DIAGNOSIS — F1097 Alcohol use, unspecified with alcohol-induced persisting dementia: Secondary | ICD-10-CM | POA: Diagnosis not present

## 2016-08-22 DIAGNOSIS — R413 Other amnesia: Secondary | ICD-10-CM

## 2016-08-22 DIAGNOSIS — F0391 Unspecified dementia with behavioral disturbance: Secondary | ICD-10-CM | POA: Diagnosis not present

## 2016-08-22 DIAGNOSIS — R296 Repeated falls: Secondary | ICD-10-CM

## 2016-08-22 NOTE — Patient Instructions (Signed)
1. Need 24/7 care.   2. We have sent a referral to [facility] for your MRI and they will call you directly to schedule your appt. They are located at [place]. If you need to contact them directly please call [phone].  3. Your provider has requested that you have labwork completed today. Please go to [facility] ([id number]) on the second floor of this building before leaving the office today. You do not need to check in. If you are not called within 15 minutes please check with the front desk.

## 2016-08-23 LAB — RPR

## 2016-08-23 LAB — VITAMIN B12: Vitamin B-12: 400 pg/mL (ref 200–1100)

## 2016-08-23 LAB — FOLATE

## 2016-08-23 LAB — TSH: TSH: 2.71 m[IU]/L (ref 0.40–4.50)

## 2016-08-24 LAB — PROTEIN ELECTROPHORESIS, SERUM
Albumin ELP: 3.8 g/dL (ref 3.8–4.8)
Alpha-1-Globulin: 0.3 g/dL (ref 0.2–0.3)
Alpha-2-Globulin: 0.7 g/dL (ref 0.5–0.9)
Beta 2: 1 g/dL — ABNORMAL HIGH (ref 0.2–0.5)
Beta Globulin: 0.5 g/dL (ref 0.4–0.6)
GAMMA GLOBULIN: 1.2 g/dL (ref 0.8–1.7)
TOTAL PROTEIN, SERUM ELECTROPHOR: 7.6 g/dL (ref 6.1–8.1)

## 2016-08-25 LAB — IMMUNOFIXATION ELECTROPHORESIS
IGM, SERUM: 38 mg/dL — AB (ref 48–271)
IgA: 1063 mg/dL — ABNORMAL HIGH (ref 81–463)
IgG (Immunoglobin G), Serum: 1396 mg/dL (ref 694–1618)

## 2016-08-25 LAB — IMMUNOFIXATION INTE

## 2016-08-25 LAB — PROTEIN ELECTROPHORESIS,RANDOM URN
CREATININE, URINE: 131 mg/dL (ref 20–370)
Protein Creatinine Ratio: 53 mg/g creat (ref 22–128)
Total Protein, Urine: 7 mg/dL (ref 5–25)

## 2016-08-27 LAB — VITAMIN B1: Vitamin B1 (Thiamine): <7 nmol/L — ABNORMAL LOW (ref 8–30)

## 2016-08-29 ENCOUNTER — Telehealth: Payer: Self-pay | Admitting: Neurology

## 2016-08-29 NOTE — Telephone Encounter (Signed)
-----   Message from Jim Wells, DO sent at 08/27/2016  4:08 PM EDT ----- Call patients wife and let her know that thiamine (B1) is low, likely due to EtOH.  Needs to be on thiamine supplement

## 2016-08-29 NOTE — Telephone Encounter (Signed)
Patient made aware.

## 2016-09-01 ENCOUNTER — Ambulatory Visit: Payer: Self-pay | Admitting: Family Medicine

## 2016-09-02 ENCOUNTER — Ambulatory Visit
Admission: RE | Admit: 2016-09-02 | Discharge: 2016-09-02 | Disposition: A | Discharge status: Home / Self Care | Payer: Medicare Other | Source: Ambulatory Visit | Attending: Neurology | Admitting: Neurology

## 2016-09-02 DIAGNOSIS — R296 Repeated falls: Secondary | ICD-10-CM

## 2016-09-02 DIAGNOSIS — R410 Disorientation, unspecified: Secondary | ICD-10-CM | POA: Diagnosis not present

## 2016-09-02 DIAGNOSIS — F1027 Alcohol dependence with alcohol-induced persisting dementia: Secondary | ICD-10-CM

## 2016-09-02 DIAGNOSIS — R413 Other amnesia: Secondary | ICD-10-CM

## 2016-09-05 ENCOUNTER — Telehealth: Payer: Self-pay | Admitting: Neurology

## 2016-09-05 NOTE — Telephone Encounter (Signed)
Patient care taker Michael Sharp needs to talk to someone about the test results please call [phone]

## 2016-09-05 NOTE — Telephone Encounter (Signed)
-----   Message from [name], DO sent at 09/05/2016  7:29 AM EST ----- Moderate atrophy.  VERY few T2 hyperintensities.  Luvenia Starch, you can let pt/wife know that MRI brain just showed that brain has shrunk some with time, which I had expected.  Doesn't show any large strokes or any significant hardening of arteries.

## 2016-09-05 NOTE — Telephone Encounter (Signed)
LMOM making patient/wife aware nothing unexpected shown on imaging.

## 2016-09-05 NOTE — Telephone Encounter (Signed)
Patient's wife made aware nothing worrisome on MRI.

## 2016-10-04 ENCOUNTER — Ambulatory Visit (INDEPENDENT_AMBULATORY_CARE_PROVIDER_SITE_OTHER): Payer: Medicare Other | Admitting: Family Medicine

## 2016-10-04 ENCOUNTER — Encounter: Payer: Self-pay | Admitting: Family Medicine

## 2016-10-04 VITALS — BP 138/62 | HR 116 | Temp 97.8°F | Resp 18 | Ht 72.0 in | Wt 164.0 lb

## 2016-10-04 DIAGNOSIS — Z23 Encounter for immunization: Secondary | ICD-10-CM

## 2016-10-04 DIAGNOSIS — F1020 Alcohol dependence, uncomplicated: Secondary | ICD-10-CM | POA: Diagnosis not present

## 2016-10-04 DIAGNOSIS — I10 Essential (primary) hypertension: Secondary | ICD-10-CM

## 2016-10-04 DIAGNOSIS — E785 Hyperlipidemia, unspecified: Secondary | ICD-10-CM

## 2016-10-04 DIAGNOSIS — F17200 Nicotine dependence, unspecified, uncomplicated: Secondary | ICD-10-CM | POA: Diagnosis not present

## 2016-10-04 LAB — COMPLETE METABOLIC PANEL WITH GFR
ALT: 18 U/L (ref 9–46)
AST: 28 U/L (ref 10–35)
Albumin: 4.2 g/dL (ref 3.6–5.1)
Alkaline Phosphatase: 73 U/L (ref 40–115)
BUN: 10 mg/dL (ref 7–25)
CALCIUM: 9.4 mg/dL (ref 8.6–10.3)
CHLORIDE: 99 mmol/L (ref 98–110)
CO2: 23 mmol/L (ref 20–31)
Creat: 1.07 mg/dL (ref 0.70–1.18)
GFR, EST AFRICAN AMERICAN: 76 mL/min (ref >=60)
GFR, Est Non African American: 66 mL/min (ref >=60)
Glucose, Bld: 79 mg/dL (ref 65–99)
POTASSIUM: 3.5 mmol/L (ref 3.5–5.3)
Sodium: 134 mmol/L — ABNORMAL LOW (ref 135–146)
Total Bilirubin: 0.9 mg/dL (ref 0.2–1.2)
Total Protein: 7.7 g/dL (ref 6.1–8.1)

## 2016-10-04 LAB — CBC WITH DIFFERENTIAL/PLATELET
BASOS ABS: 0 {cells}/uL (ref 0–200)
Basophils Relative: 0 %
EOS PCT: 0 %
Eosinophils Absolute: 0 cells/uL — ABNORMAL LOW (ref 15–500)
HCT: 44.2 % (ref 38.5–50.0)
Hemoglobin: 15.2 g/dL (ref 13.2–17.1)
LYMPHS PCT: 39 %
Lymphs Abs: 2457 cells/uL (ref 850–3900)
MCH: 34.2 pg — AB (ref 27.0–33.0)
MCHC: 34.4 g/dL (ref 32.0–36.0)
MCV: 99.5 fL (ref 80.0–100.0)
MONOS PCT: 10 %
MPV: 9.3 fL (ref 7.5–12.5)
Monocytes Absolute: 630 cells/uL (ref 200–950)
NEUTROS ABS: 3213 {cells}/uL (ref 1500–7800)
NEUTROS PCT: 51 %
PLATELETS: 217 10*3/uL (ref 140–400)
RBC: 4.44 MIL/uL (ref 4.20–5.80)
RDW: 14.4 % (ref 11.0–15.0)
WBC: 6.3 10*3/uL (ref 3.8–10.8)

## 2016-10-04 MED ORDER — ATORVASTATIN CALCIUM 20 MG PO TABS: 20.0000 mg | ORAL_TABLET | Freq: Every day | ORAL | 3 refills | Status: DC

## 2016-10-04 MED ORDER — HYDROCHLOROTHIAZIDE 25 MG PO TABS: 25.0000 mg | ORAL_TABLET | Freq: Every day | ORAL | 3 refills | Status: DC

## 2016-10-04 MED ORDER — AMLODIPINE BESYLATE 10 MG PO TABS: 10.0000 mg | ORAL_TABLET | Freq: Every day | ORAL | 1 refills | Status: DC

## 2016-10-04 MED ORDER — VITAMIN B-1 50 MG PO TABS: 50.0000 mg | ORAL_TABLET | Freq: Every day | ORAL | 0 refills | Status: DC

## 2016-10-04 MED ORDER — FOLIC ACID 1 MG PO TABS: 1.0000 mg | ORAL_TABLET | Freq: Every day | ORAL | 1 refills | Status: DC

## 2016-10-04 MED ORDER — GABAPENTIN 300 MG PO CAPS: 300.0000 mg | ORAL_CAPSULE | Freq: Every day | ORAL | 3 refills | Status: DC

## 2016-10-04 MED ORDER — FOLIC ACID 1 MG PO TABS: 1.0000 mg | ORAL_TABLET | Freq: Every day | ORAL | 11 refills | Status: DC

## 2016-10-04 NOTE — Patient Instructions (Addendum)
Hypertension Hypertension, commonly called high blood pressure, is when the force of blood pumping through your arteries is too strong. Your arteries are the blood vessels that carry blood from your heart throughout your body. A blood pressure reading consists of a higher number over a lower number, such as 110/72. The higher number (systolic) is the pressure inside your arteries when your heart pumps. The lower number (diastolic) is the pressure inside your arteries when your heart relaxes. Ideally you want your blood pressure below 120/80. Hypertension forces your heart to work harder to pump blood. Your arteries may become narrow or stiff. Having untreated or uncontrolled hypertension can cause heart attack, stroke, kidney disease, and other problems. What increases the risk? Some risk factors for high blood pressure are controllable. Others are not. Risk factors you cannot control include:  Race. You may be at higher risk if you are African American.  Age. Risk increases with age.  Gender. Men are at higher risk than women before age 45 years. After age 65, women are at higher risk than men. Risk factors you can control include:  Not getting enough exercise or physical activity.  Being overweight.  Getting too much fat, sugar, calories, or salt in your diet.  Drinking too much alcohol. What are the signs or symptoms? Hypertension does not usually cause signs or symptoms. Extremely high blood pressure (hypertensive crisis) may cause headache, anxiety, shortness of breath, and nosebleed. How is this diagnosed? To check if you have hypertension, your health care provider will measure your blood pressure while you are seated, with your arm held at the level of your heart. It should be measured at least twice using the same arm. Certain conditions can cause a difference in blood pressure between your right and left arms. A blood pressure reading that is higher than normal on one occasion does  not mean that you need treatment. If it is not clear whether you have high blood pressure, you may be asked to return on a different day to have your blood pressure checked again. Or, you may be asked to monitor your blood pressure at home for 1 or more weeks. How is this treated? Treating high blood pressure includes making lifestyle changes and possibly taking medicine. Living a healthy lifestyle can help lower high blood pressure. You may need to change some of your habits. Lifestyle changes may include:  Following the DASH diet. This diet is high in fruits, vegetables, and whole grains. It is low in salt, red meat, and added sugars.  Keep your sodium intake below 2,300 mg per day.  Getting at least 30-45 minutes of aerobic exercise at least 4 times per week.  Losing weight if necessary.  Not smoking.  Limiting alcoholic beverages.  Learning ways to reduce stress. Your health care provider may prescribe medicine if lifestyle changes are not enough to get your blood pressure under control, and if one of the following is true:  You are 18-59 years of age and your systolic blood pressure is above 140.  You are 60 years of age or older, and your systolic blood pressure is above 150.  Your diastolic blood pressure is above 90.  You have diabetes, and your systolic blood pressure is over 140 or your diastolic blood pressure is over 90.  You have kidney disease and your blood pressure is above 140/90.  You have heart disease and your blood pressure is above 140/90. Your personal target blood pressure may vary depending on your medical   conditions, your age, and other factors. Follow these instructions at home:  Have your blood pressure rechecked as directed by your health care provider.  Take medicines only as directed by your health care provider. Follow the directions carefully. Blood pressure medicines must be taken as prescribed. The medicine does not work as well when you skip  doses. Skipping doses also puts you at risk for problems.  Do not smoke.  Monitor your blood pressure at home as directed by your health care provider. Contact a health care provider if:  You think you are having a reaction to medicines taken.  You have recurrent headaches or feel dizzy.  You have swelling in your ankles.  You have trouble with your vision. Get help right away if:  You develop a severe headache or confusion.  You have unusual weakness, numbness, or feel faint.  You have severe chest or abdominal pain.  You vomit repeatedly.  You have trouble breathing. This information is not intended to replace advice given to you by your health care provider. Make sure you discuss any questions you have with your health care provider. Document Released: 10/17/2005 Document Revised: 03/24/2016 Document Reviewed: 08/09/2013 Elsevier Interactive Patient Education  2017 [employer]  Dyslipidemia Dyslipidemia is an imbalance of waxy, fat-like substances (lipids) in the blood. The body needs lipids in small amounts. Dyslipidemia often involves a high level of cholesterol or triglycerides, which are types of lipids. Common forms of dyslipidemia include:  High levels of bad cholesterol (LDL cholesterol). LDL is the type of cholesterol that causes fatty deposits (plaques) to build up in the blood vessels that carry blood away from your heart (arteries).  Low levels of good cholesterol (HDL cholesterol). HDL cholesterol is the type of cholesterol that protects against heart disease. High levels of HDL remove the LDL buildup from arteries.  High levels of triglycerides. Triglycerides are a fatty substance in the blood that is linked to a buildup of plaques in the arteries. You can develop dyslipidemia because of the genes you are born with (primary dyslipidemia) or changes that occur during your life (secondary dyslipidemia), or as a side effect of certain medical treatments. What  are the causes? Primary dyslipidemia is caused by changes (mutations) in genes that are passed down through families (inherited). These mutations cause several types of dyslipidemia. Mutations can result in disorders that make the body produce too much LDL cholesterol or triglycerides, or not enough HDL cholesterol. These disorders may lead to heart disease, arterial disease, or stroke at an early age. Causes of secondary dyslipidemia include certain lifestyle choices and diseases that lead to dyslipidemia, such as:  Eating a diet that is high in animal fat.  Not getting enough activity or exercise (having a sedentary lifestyle).  Having diabetes, kidney disease, liver disease, or thyroid disease.  Drinking large amounts of alcohol.  Using certain types of drugs. What increases the risk? You may be at greater risk for dyslipidemia if you are an older man or if you are a woman who has gone through menopause. Other risk factors include:  Having a family history of dyslipidemia.  Taking certain medicines, including birth control pills, steroids, some diuretics, beta-blockers, and some medicines forHIV.  Smoking cigarettes.  Eating a high-fat diet.  Drinking large amounts of alcohol.  Having certain medical conditions such as diabetes, polycystic ovary syndrome (PCOS), pregnancy, kidney disease, liver disease, or hypothyroidism.  Not exercising regularly.  Being overweight or obese with too much belly fat. What are the  signs or symptoms? Dyslipidemia does not usually cause any symptoms. Very high lipid levels can cause fatty bumps under the skin (xanthomas) or a white or gray ring around the black center (pupil) of the eye. Very high triglyceride levels can cause inflammation of the pancreas (pancreatitis). How is this diagnosed? Your health care provider may diagnose dyslipidemia based on a routine blood test (fasting blood test). Because most people do not have symptoms of the  condition, this blood testing (lipid profile) is done on adults age 42 and older and is repeated every 5 years. This test checks:  Total cholesterol. This is a measure of the total amount of cholesterol in your blood, including LDL cholesterol, HDL cholesterol, and triglycerides. A healthy number is below 200.  LDL cholesterol. The target number for LDL cholesterol is different for each person, depending on individual risk factors. For most people, a number below 100 is healthy. Ask your health care provider what your LDL cholesterol number should be.  HDL cholesterol. An HDL level of 60 or higher is best because it helps to protect against heart disease. A number below 27 for men or below 94 for women increases the risk for heart disease.  Triglycerides. A healthy triglyceride number is below 150. If your lipid profile is abnormal, your health care provider may do other blood tests to get more information about your condition. How is this treated? Treatment depends on the type of dyslipidemia that you have and your other risk factors for heart disease and stroke. Your health care provider will have a target range for your lipid levels based on this information. For many people, treatment starts with lifestyle changes, such as diet and exercise. Your health care provider may recommend that you:  Get regular exercise.  Make changes to your diet.  Quit smoking if you smoke. If diet changes and exercise do not help you reach your goals, your health care provider may also prescribe medicine to lower lipids. The most commonly prescribed type of medicine lowers your LDL cholesterol (statin drug). If you have a high triglyceride level, your provider may prescribe another type of drug (fibrate) or an omega-3 fish oil supplement, or both. Follow these instructions at home:  Take over-the-counter and prescription medicines only as told by your health care provider. This includes supplements.  Get  regular exercise. Start an aerobic exercise and strength training program as told by your health care provider. Ask your health care provider what activities are safe for you. Your health care provider may recommend:  30 minutes of aerobic activity 4-6 days a week. Brisk walking is an example of aerobic activity.  Strength training 2 days a week.  Eat a healthy diet as told by your health care provider. This can help you reach and maintain a healthy weight, lower your LDL cholesterol, and raise your HDL cholesterol. It may help to work with a diet and nutrition specialist (dietitian) to make a plan that is right for you. Your dietitian or health care provider may recommend:  Limiting your calories, if you are overweight.  Eating more fruits, vegetables, whole grains, fish, and lean meats.  Limiting saturated fat, trans fat, and cholesterol.  Follow instructions from your health care provider or dietitian about eating or drinking restrictions.  Limit alcohol intake to no more than one drink per day for nonpregnant women and two drinks per day for men. One drink equals 12 oz of beer, 5 oz of wine, or 1 oz of hard liquor.  Do not use any products that contain nicotine or tobacco, such as cigarettes and e-cigarettes. If you need help quitting, ask your health care provider.  Keep all follow-up visits as told by your health care provider. This is important. Contact a health care provider if:  You are having trouble sticking to your exercise or diet plan.  You are struggling to quit smoking or control your use of alcohol. Summary  Dyslipidemia is an imbalance of waxy, fat-like substances (lipids) in the blood. The body needs lipids in small amounts. Dyslipidemia often involves a high level of cholesterol or triglycerides, which are types of lipids.  Treatment depends on the type of dyslipidemia that you have and your other risk factors for heart disease and stroke.  For many people,  treatment starts with lifestyle changes, such as diet and exercise. Your health care provider may also prescribe medicine to lower lipids. This information is not intended to replace advice given to you by your health care provider. Make sure you discuss any questions you have with your health care provider. Document Released: 10/22/2013 Document Revised: 06/13/2016 Document Reviewed: 06/13/2016 Elsevier Interactive Patient Education  2017 [employer].

## 2016-10-04 NOTE — Progress Notes (Signed)
Subjective:    Patient ID: Michael Sharp, male    DOB: [birth year], 81 y.o.   MRN: [id number]  HPI Michael Sharp, a 81 year old male with a history of hypertension and hypercholesteremia presents for a 6 month follow up of chronic conditions.  He is not exercising and is not adherent to low salt diet.  Patient does not check blood pressures at home.   Patient denies chest pain, dyspnea, fatigue, irregular heart beat, palpitations, syncope and tachypnea.  Cardiovascular risk factors include: dyslipidemia, hypertension and smoking/ tobacco exposure.   Michael Sharp also has a history of daily alcohol use. He state that he drinks 2-3 beers every afternoon. He does not feel the need to quit, he denies needing an early morning eye opener and does not feel guilty about using alcohol.  Patient is also an everyday smoker. He says that he has been smoking since he was 81 years old. He continues to smoke 1 pack per day.  Past Medical History:  Diagnosis Date  . Hypercholesteremia   . Hypertension    Social History   Social History  . Marital status: Married    Spouse name: N/A  . Number of children: N/A  . Years of education: N/A   Occupational History  . Not on file.   Social History Main Topics  . Smoking status: Current Every Day Smoker    Packs/day: 0.50    Years: 60.00    Types: Cigarettes  . Smokeless tobacco: Not on file  . Alcohol use 31.2 oz/week    42 Cans of beer, 10 Shots of liquor per week     Comment: "Couple of beers a day" 2 per day   . Drug use: No  . Sexual activity: Not on file   Other Topics Concern  . Not on file   Social History Narrative  . No narrative on file   Immunization History  Administered Date(s) Administered  . Pneumococcal Conjugate-13 12/18/2014  . Tdap 01/26/2015   Review of Systems  Constitutional: Negative.  Negative for fatigue, fever and unexpected weight change.  HENT: Negative.   Eyes: Negative.   Respiratory: Negative.    Cardiovascular: Negative.  Negative for palpitations and leg swelling.  Gastrointestinal: Negative.   Endocrine: Negative.  Negative for polydipsia, polyphagia and polyuria.  Genitourinary: Negative.   Musculoskeletal: Negative.   Skin: Negative.   Allergic/Immunologic: Negative.  Negative for immunocompromised state.  Neurological: Positive for tremors.  Hematological: Negative.  Negative for adenopathy. Does not bruise/bleed easily.       Objective:   Physical Exam  Constitutional: He is oriented to person, place, and time. He appears well-developed and well-nourished.  HENT:  Head: Normocephalic and atraumatic.  Right Ear: External ear normal.  Left Ear: External ear normal.  Nose: Nose normal.  Mouth/Throat: Oropharynx is clear and moist.  Eyes: Conjunctivae and EOM are normal. Pupils are equal, round, and reactive to light.  Neck: Normal range of motion.  Cardiovascular: Normal rate, normal heart sounds and intact distal pulses.   Pulmonary/Chest: Effort normal and breath sounds normal.  Abdominal: Soft. Bowel sounds are normal.  Musculoskeletal: Normal range of motion.  Neurological: He is alert and oriented to person, place, and time. He has normal strength and normal reflexes. He displays a negative Romberg sign.  Resting tremor  Skin: Skin is warm and dry.  Psychiatric: He has a normal mood and affect. His behavior is normal. Judgment and thought content normal.  BP (!) 165/82 (BP Location: Right Arm, Patient Position: Sitting, Cuff Size: Normal)   Pulse (!) 116   Temp 97.8 F (36.6 C) (Oral)   Resp 18   Ht 6' (1.829 m)   Wt 164 lb (74.4 kg)   SpO2 93%   BMI 22.24 kg/m  Assessment & Plan:   1. Essential hypertension Patient unable to provide urine sample, voided prior to arrival.  The patient is asked to make an attempt to improve diet and exercise patterns to aid in medical management of this problem. - COMPLETE METABOLIC PANEL WITH GFR - amLODipine  (NORVASC) 10 MG tablet; Take 1 tablet (10 mg total) by mouth daily.  Dispense: 90 tablet; Refill: 1 - CBC with Differential  2. Tobacco use disorder Smoking cessation instruction/counseling given:  counseled patient on the dangers of tobacco use, advised patient to stop smoking, and reviewed strategies to maximize success  3. Need for immunization against influenza  - Flu Vaccine QUAD 36+ mos IM (Fluarix)  4. Alcoholism ([place]) - folic acid (FOLVITE) 1 MG tablet; Take 1 tablet (1 mg total) by mouth daily.  Dispense: 90 tablet; Refill: 1 - CBC with Differential - thiamine (VITAMIN B-1) 50 MG tablet; Take 1 tablet (50 mg total) by mouth daily.  Dispense: 90 tablet; Refill: 0 - gabapentin (NEURONTIN) 300 MG capsule; Take 1 capsule (300 mg total) by mouth at bedtime.  Dispense: 90 capsule; Refill: 3  5. Hyperlipidemia LDL goal <100 Reviewed previous lipid panel, will check in 6 months.  - atorvastatin (LIPITOR) 20 MG tablet; Take 1 tablet (20 mg total) by mouth daily.  Dispense: 90 tablet; Refill: 3  RTC: 6 months for chronic conditions    Kailah Pennel M, FNP

## 2017-04-04 ENCOUNTER — Encounter: Payer: Self-pay | Admitting: Family Medicine

## 2017-04-04 ENCOUNTER — Ambulatory Visit (INDEPENDENT_AMBULATORY_CARE_PROVIDER_SITE_OTHER): Payer: Medicare Other | Admitting: Family Medicine

## 2017-04-04 VITALS — BP 142/78 | HR 108 | Temp 97.6°F | Resp 16 | Ht 72.0 in | Wt 163.0 lb

## 2017-04-04 DIAGNOSIS — Z23 Encounter for immunization: Secondary | ICD-10-CM | POA: Diagnosis not present

## 2017-04-04 DIAGNOSIS — F17200 Nicotine dependence, unspecified, uncomplicated: Secondary | ICD-10-CM

## 2017-04-04 DIAGNOSIS — I10 Essential (primary) hypertension: Secondary | ICD-10-CM | POA: Diagnosis not present

## 2017-04-04 DIAGNOSIS — F1094 Alcohol use, unspecified with alcohol-induced mood disorder: Secondary | ICD-10-CM | POA: Diagnosis not present

## 2017-04-04 DIAGNOSIS — E785 Hyperlipidemia, unspecified: Secondary | ICD-10-CM | POA: Diagnosis not present

## 2017-04-04 LAB — CBC WITH DIFFERENTIAL/PLATELET
BASOS ABS: 0 {cells}/uL (ref 0–200)
BASOS PCT: 0 %
EOS ABS: 0 {cells}/uL — AB (ref 15–500)
Eosinophils Relative: 0 %
HEMATOCRIT: 44.4 % (ref 38.5–50.0)
HEMOGLOBIN: 15.2 g/dL (ref 13.2–17.1)
LYMPHS ABS: 1680 {cells}/uL (ref 850–3900)
Lymphocytes Relative: 28 %
MCH: 35.6 pg — AB (ref 27.0–33.0)
MCHC: 34.2 g/dL (ref 32.0–36.0)
MCV: 104 fL — AB (ref 80.0–100.0)
MONO ABS: 780 {cells}/uL (ref 200–950)
MPV: 9.4 fL (ref 7.5–12.5)
Monocytes Relative: 13 %
NEUTROS ABS: 3540 {cells}/uL (ref 1500–7800)
Neutrophils Relative %: 59 %
Platelets: 201 10*3/uL (ref 140–400)
RBC: 4.27 MIL/uL (ref 4.20–5.80)
RDW: 14.1 % (ref 11.0–15.0)
WBC: 6 10*3/uL (ref 3.8–10.8)

## 2017-04-04 LAB — POCT URINALYSIS DIP (DEVICE)
Bilirubin Urine: NEGATIVE
GLUCOSE, UA: NEGATIVE mg/dL
Hgb urine dipstick: NEGATIVE
Ketones, ur: NEGATIVE mg/dL
LEUKOCYTES UA: NEGATIVE
NITRITE: NEGATIVE
Protein, ur: NEGATIVE mg/dL
Specific Gravity, Urine: 1.015 (ref 1.005–1.030)
UROBILINOGEN UA: 0.2 mg/dL (ref 0.0–1.0)
pH: 7 (ref 5.0–8.0)

## 2017-04-04 NOTE — Progress Notes (Signed)
Subjective:    Patient ID: Michael Sharp, male    DOB: [birth year], 82 y.o.   MRN: [id number]  HPI Mr. Michael Sharp, a 82 year old male with a history of hypertension and hypercholesteremia presents for a 6 month follow up of chronic conditions. He says that he is doing well and is without complaint.   He is not exercising, he generally stays active.  He is not adherent to a low sodium diet. Patient does not check blood pressures at home.   Patient denies chest pain, dyspnea, fatigue, irregular heart beat, palpitations, syncope and tachypnea.  Cardiovascular risk factors include: dyslipidemia, hypertension and smoking/ tobacco exposure.   Mr. Moree continues to drink alcohol daily. He state that he drinks 2-3 beers every afternoon. He does not feel the need to quit, he denies needing an early morning eye opener and does not feel guilty about using alcohol.  Patient is also an everyday smoker. He says that he has been smoking since he was 82 years old. He continues to smoke 1 pack per day. He is not interested in quitting at this time.   Past Medical History:  Diagnosis Date  . Hypercholesteremia   . Hypertension    Social History   Social History  . Marital status: Married    Spouse name: N/A  . Number of children: N/A  . Years of education: N/A   Occupational History  . Not on file.   Social History Main Topics  . Smoking status: Current Every Day Smoker    Packs/day: 0.50    Years: 60.00    Types: Cigarettes  . 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 Alcohol use 31.2 oz/week    42 Cans of beer, 10 Shots of liquor per week     Comment: "Couple of beers a day" 2 per day   . Drug use: No  . Sexual activity: Not on file   Other Topics Concern  . Not on file   Social History Narrative  . No narrative on file   Immunization History  Administered Date(s) Administered  . Influenza,inj,Quad PF,36+ Mos 10/04/2016  . Pneumococcal Conjugate-13 12/18/2014  . Tdap 01/26/2015    Review of Systems  Constitutional: Negative.  Negative for fatigue, fever and unexpected weight change.  HENT: Negative.   Eyes: Negative.   Respiratory: Negative.   Cardiovascular: Negative.  Negative for leg swelling.  Gastrointestinal: Negative.   Endocrine: Negative.  Negative for polydipsia, polyphagia and polyuria.  Genitourinary: Negative.   Musculoskeletal: Negative.   Skin: Negative.   Allergic/Immunologic: Negative.  Negative for immunocompromised state.  Neurological: Positive for tremors.  Hematological: Negative.  Negative for adenopathy. Does not bruise/bleed easily.       Objective:   Physical Exam  Constitutional: He is oriented to person, place, and time. He appears well-developed and well-nourished.  HENT:  Head: Normocephalic and atraumatic.  Right Ear: External ear normal.  Left Ear: External ear normal.  Nose: Nose normal.  Mouth/Throat: Oropharynx is clear and moist.  Eyes: Conjunctivae and EOM are normal. Pupils are equal, round, and reactive to light.  Neck: Normal range of motion.  Cardiovascular: Normal rate, normal heart sounds and intact distal pulses.   Pulmonary/Chest: Effort normal and breath sounds normal.  Abdominal: Soft. Bowel sounds are normal.  Musculoskeletal: Normal range of motion.  Neurological: He is alert and oriented to person, place, and time. He has normal strength and normal reflexes. He displays a negative Romberg sign.  Resting tremor  Skin: Skin is warm and dry.  Psychiatric: He has a normal mood and affect. His behavior is normal. Judgment and thought content normal.      BP (!) 142/78 Comment: manually  Pulse (!) 108   Temp 97.6 F (36.4 C) (Oral)   Resp 16   Ht 6' (1.829 m)   Wt 163 lb (73.9 kg)   SpO2 99%   BMI 22.11 kg/m  Assessment & Plan:   1. Essential hypertension Blood pressure is at goal on current medication regimen. Will continue medications.  - COMPLETE METABOLIC PANEL WITH GFR - CBC with  Differential  2. Alcohol-induced mood disorder (HCC) Continue folic acid and thiamine   3. Tobacco use disorder Smoking cessation instruction/counseling given:  counseled patient on the dangers of tobacco use, advised patient to stop smoking, and reviewed strategies to maximize success  4. Hyperlipidemia LDL goal <100 The patient is asked to make an attempt to improve diet and in patterns to crease activity patterns to aid in medical management of this problem.  5. Immunization due - Pneumococcal polysaccharide vaccine 23-valent greater than or equal to 2yo subcutaneous/IM     RTC: 6 months for Medicare Wellness visit  Donia Pounds  MSN, FNP-C [address], [place] [address] [phone]

## 2017-04-04 NOTE — Patient Instructions (Addendum)
Blood pressure is at goal on current medication regimen. Will continue medications at current dosage.   Discussed smoking cessation. He is not interested in quitting.    Will follow up by phone with any abnormal laboratory results. Alcohol Abuse and Nutrition Alcohol abuse is any pattern of alcohol consumption that harms your health, relationships, or work. Alcohol abuse can affect how your body breaks down and absorbs nutrients from food by causing your liver to work abnormally. Additionally, many people who abuse alcohol do not eat enough carbohydrates, protein, fat, vitamins, and minerals. This can cause poor nutrition (malnutrition) and a lack of nutrients (nutrient deficiencies), which can lead to further complications. Nutrients that are commonly lacking (deficient) among people who abuse alcohol include:  Vitamins. ? Vitamin A. This is stored in your liver. It is important for your vision, metabolism, and ability to fight off infections (immunity). ? B vitamins. These include vitamins such as folate, thiamin, and niacin. These are important in new cell growth and maintenance. ? Vitamin C. This plays an important role in iron absorption, wound healing, and immunity. ? Vitamin D. This is produced by your liver, but you can also get vitamin D from food. Vitamin D is necessary for your body to absorb and use calcium.  Minerals. ? Calcium. This is important for your bones and your heart and blood vessel (cardiovascular) function. ? Iron. This is important for blood, muscle, and nervous system functioning. ? Magnesium. This plays an important role in muscle and nerve function, and it helps to control blood sugar and blood pressure. ? Zinc. This is important for the normal function of your nervous system and digestive system (gastrointestinal tract).  Nutrition is an essential component of therapy for alcohol abuse. Your health care provider or dietitian will work with you to design a plan that  can help restore nutrients to your body and prevent potential complications. What is my plan? Your dietitian may develop a specific diet plan that is based on your condition and any other complications you may have. A diet plan will commonly include:  A balanced diet. ? Grains: 6-8 oz per day. ? Vegetables: 2-3 cups per day. ? Fruits: 1-2 cups per day. ? Meat and other protein: 5-6 oz per day. ? Dairy: 2-3 cups per day.  Vitamin and mineral supplements.  What do I need to know about alcohol and nutrition?  Consume foods that are high in antioxidants, such as grapes, berries, nuts, green tea, and dark green and orange vegetables. This can help to counteract some of the stress that is placed on your liver by consuming alcohol.  Avoid food and drinks that are high in fat and sugar. Foods such as sugared soft drinks, salty snack foods, and candy contain empty calories. This means that they lack important nutrients such as protein, fiber, and vitamins.  Eat frequent meals and snacks. Try to eat 5-6 small meals each day.  Eat a variety of fresh fruits and vegetables each day. This will help you get plenty of water, fiber, and vitamins in your diet.  Drink plenty of water and other clear fluids. Try to drink at least 48-64 oz (1.5-2 L) of water per day.  If you are a vegetarian, eat a variety of protein-rich foods. Pair whole grains with plant-based proteins at meals and snacks to obtain the greatest nutrient benefit from your food. For example, eat rice with beans, put peanut butter on whole-grain toast, or eat oatmeal with sunflower seeds.  Soak beans  and whole grains overnight before cooking. This can help your body to absorb the nutrients more easily.  Include foods fortified with vitamins and minerals in your diet. Commonly fortified foods include milk, orange juice, cereal, and bread.  If you are malnourished, your dietitian may recommend a high-protein, high-calorie diet. This may  include: ? 2,000-3,000 calories (kilocalories) per day. ? 70-100 grams of protein per day.  Your health care provider may recommend a complete nutritional supplement beverage. This can help to restore calories, protein, and vitamins to your body. Depending on your condition, you may be advised to consume this instead of or in addition to meals.  Limit your intake of caffeine. Replace drinks like coffee and black tea with decaffeinated coffee and herbal tea.  Eat a variety of foods that are high in omega fatty acids. These include fish, nuts and seeds, and soybeans. These foods may help your liver to recover and may also stabilize your mood.  Certain medicines may cause changes in your appetite, taste, and weight. Work with your health care provider and dietitian to make any adjustments to your medicines and diet plan.  Include other healthy lifestyle choices in your daily routine. ? Be physically active. ? Get enough sleep. ? Spend time doing activities that you enjoy.  If you are unable to take in enough food and calories by mouth, your health care provider may recommend a feeding tube. This is a tube that passes through your nose and throat, directly into your stomach. Nutritional supplement beverages can be given to you through the feeding tube to help you get the nutrients you need.  Take vitamin or mineral supplements as recommended by your health care provider. What foods can I eat? Grains Enriched pasta. Enriched rice. Fortified whole-grain bread. Fortified whole-grain cereal. Barley. Brown rice. Quinoa. [place]. Vegetables All fresh, frozen, and canned vegetables. Spinach. Kale. Artichoke. Carrots. Winter squash and pumpkin. Sweet potatoes. Broccoli. Cabbage. Cucumbers. Tomatoes. Sweet peppers. Green beans. Peas. Corn. Fruits All fresh and frozen fruits. Berries. Grapes. Mango. Papaya. Guava. Cherries. Apples. Bananas. Peaches. Plums. Pineapple. Watermelon. Cantaloupe. Oranges.  Avocado. Meats and Other Protein Sources Beef liver. Lean beef. Pork. Fresh and canned chicken. Fresh fish. Oysters. Sardines. Canned tuna. Shrimp. Eggs with yolks. Nuts and seeds. Peanut butter. Beans and lentils. Soybeans. Tofu. Dairy Whole, low-fat, and nonfat milk. Whole, low-fat, and nonfat yogurt. Cottage cheese. Sour cream. Hard and soft cheeses. Beverages Water. Herbal tea. Decaffeinated coffee. Decaffeinated green tea. 100% fruit juice. 100% vegetable juice. Instant breakfast shakes. Condiments Ketchup. Mayonnaise. Mustard. Salad dressing. Barbecue sauce. Sweets and Desserts Sugar-free ice cream. Sugar-free pudding. Sugar-free gelatin. Fats and Oils Butter. Vegetable oil, flaxseed oil, olive oil, and walnut oil. Other Complete nutrition shakes. Protein bars. Sugar-free gum. The items listed above may not be a complete list of recommended foods or beverages. Contact your dietitian for more options. What foods are not recommended? Grains Sugar-sweetened breakfast cereals. Flavored instant oatmeal. Fried breads. Vegetables Breaded or deep-fried vegetables. Fruits Dried fruit with added sugar. Candied fruit. Canned fruit in syrup. Meats and Other Protein Sources Breaded or deep-fried meats. Dairy Flavored milks. Fried cheese curds or fried cheese sticks. Beverages Alcohol. Sugar-sweetened soft drinks. Sugar-sweetened tea. Caffeinated coffee and tea. Condiments Sugar. Honey. Agave nectar. Molasses. Sweets and Desserts Chocolate. Cake. Cookies. Candy. Other Potato chips. Pretzels. Salted nuts. Candied nuts. The items listed above may not be a complete list of foods and beverages to avoid. Contact your dietitian for more information. This information is not  intended to replace advice given to you by your health care provider. Make sure you discuss any questions you have with your health care provider. Document Released: 08/11/2005 Document Revised: 02/24/2016 Document Reviewed:  05/20/2014 Elsevier Interactive Patient Education  2018 [employer].  Alcohol Use Disorder Alcohol use disorder is when your drinking disrupts your daily life. When you have this condition, you drink too much alcohol and you cannot control your drinking. Alcohol use disorder can cause serious problems with your physical health. It can affect your brain, heart, liver, pancreas, immune system, stomach, and intestines. Alcohol use disorder can increase your risk for certain cancers and cause problems with your mental health, such as depression, anxiety, psychosis, delirium, and dementia. People with this disorder risk hurting themselves and others. What are the causes? This condition is caused by drinking too much alcohol over time. It is not caused by drinking too much alcohol only one or two times. Some people with this condition drink alcohol to cope with or escape from negative life events. Others drink to relieve pain or symptoms of mental illness. What increases the risk? You are more likely to develop this condition if:  You have a family history of alcohol use disorder.  Your culture encourages drinking to the point of intoxication, or makes alcohol easy to get.  You had a mood or conduct disorder in childhood.  You have been a victim of abuse.  You are an adolescent and: ? You have poor grades or difficulties in school. ? Your caregivers do not talk to you about saying no to alcohol, or supervise your activities. ? You are impulsive or you have trouble with self-control.  What are the signs or symptoms? Symptoms of this condition include:  Drinkingmore than you want to.  Drinking for longer than you want to.  Trying several times to drink less or to control your drinking.  Spending a lot of time getting alcohol, drinking, or recovering from drinking.  Craving alcohol.  Having problems at work, at school, or at home due to drinking.  Having problems in relationships due  to drinking.  Drinking when it is dangerous to drink, such as before driving a car.  Continuing to drink even though you know you might have a physical or mental problem related to drinking.  Needing more and more alcohol to get the same effect you want from the alcohol (building up tolerance).  Having symptoms of withdrawal when you stop drinking. Symptoms of withdrawal include: ? Fatigue. ? Nightmares. ? Trouble sleeping. ? Depression. ? Anxiety. ? Fever. ? Seizures. ? Severe confusion. ? Feeling or seeing things that are not there (hallucinations). ? Tremors. ? Rapid heart rate. ? Rapid breathing. ? High blood pressure.  Drinking to avoid symptoms of withdrawal.  How is this diagnosed? This condition is diagnosed with an assessment. Your health care provider may start the assessment by asking three or four questions about your drinking. Your health care provider may perform a physical exam or do lab tests to see if you have physical problems resulting from alcohol use. She or he may refer you to a mental health professional for evaluation. How is this treated? Some people with alcohol use disorder are able to reduce their alcohol use to low-risk levels. Others need to completely quit drinking alcohol. When necessary, mental health professionals with specialized training in substance use treatment can help. Your health care provider can help you decide how severe your alcohol use disorder is and what type  of treatment you need. The following forms of treatment are available:  Detoxification. Detoxification involves quitting drinking and using prescription medicines within the first week to help lessen withdrawal symptoms. This treatment is important for people who have had withdrawal symptoms before and for heavy drinkers who are likely to have withdrawal symptoms. Alcohol withdrawal can be dangerous, and in severe cases, it can cause death. Detoxification may be provided in a home,  community, or primary care setting, or in a hospital or substance use treatment facility.  Counseling. This treatment is also called talk therapy. It is provided by substance use treatment counselors. A counselor can address the reasons you use alcohol and suggest ways to keep you from drinking again or to prevent problem drinking. The goals of talk therapy are to: ? Find healthy activities and ways for you to cope with stress. ? Identify and avoid the things that trigger your alcohol use. ? Help you learn how to handle cravings.  Medicines.Medicines can help treat alcohol use disorder by: ? Decreasing alcohol cravings. ? Decreasing the positive feeling you have when you drink alcohol. ? Causing an uncomfortable physical reaction when you drink alcohol (aversion therapy).  Support groups. Support groups are led by people who have quit drinking. They provide emotional support, advice, and guidance.  These forms of treatment are often combined. Some people with this condition benefit from a combination of treatments provided by specialized substance use treatment centers. Follow these instructions at home:  Take over-the-counter and prescription medicines only as told by your health care provider.  Check with your health care provider before starting any new medicines.  Ask friends and family members not to offer you alcohol.  Avoid situations where alcohol is served, including gatherings where others are drinking alcohol.  Create a plan for what to do when you are tempted to use alcohol.  Find hobbies or activities that you enjoy that do not include alcohol.  Keep all follow-up visits as told by your health care provider. This is important. How is this prevented?  If you drink, limit alcohol intake to no more than 1 drink a day for nonpregnant women and 2 drinks a day for men. One drink equals 12 oz of beer, 5 oz of wine, or 1 oz of hard liquor.  If you have a mental health  condition, get treatment and support.  Do not give alcohol to adolescents.  If you are an adolescent: ? Do not drink alcohol. ? Do not be afraid to say no if someone offers you alcohol. Speak up about why you do not want to drink. You can be a positive role model for your friends and set a good example for those around you by not drinking alcohol. ? If your friends drink, spend time with others who do not drink alcohol. Make new friends who do not use alcohol. ? Find healthy ways to manage stress and emotions, such as meditation or deep breathing, exercise, spending time in nature, listening to music, or talking with a trusted friend or family member. Contact a health care provider if:  You are not able to take your medicines as told.  Your symptoms get worse.  You return to drinking alcohol (relapse) and your symptoms get worse. Get help right away if:  You have thoughts about hurting yourself or others. If you ever feel like you may hurt yourself or others, or have thoughts about taking your own life, get help right away. You can go to your  nearest emergency department or call:  Your local emergency services (911 in the [place]  A suicide crisis helpline, such as the [facility] at 5107358698. This is open 24 hours a day.  Summary  Alcohol use disorder is when your drinking disrupts your daily life. When you have this condition, you drink too much alcohol and you cannot control your drinking.  Treatment may include detoxification, counseling, medicine, and support groups.  Ask friends and family members not to offer you alcohol. Avoid situations where alcohol is served.  Get help right away if you have thoughts about hurting yourself or others. This information is not intended to replace advice given to you by your health care provider. Make sure you discuss any questions you have with your health care provider. Document Released: 11/24/2004 Document  Revised: 07/14/2016 Document Reviewed: 07/14/2016 Elsevier Interactive Patient Education  2018 [place] Eating Plan DASH stands for "Dietary Approaches to Stop Hypertension." The DASH eating plan is a healthy eating plan that has been shown to reduce high blood pressure (hypertension). It may also reduce your risk for type 2 diabetes, heart disease, and stroke. The DASH eating plan may also help with weight loss. What are tips for following this plan? General guidelines  Avoid eating more than 2,300 mg (milligrams) of salt (sodium) a day. If you have hypertension, you may need to reduce your sodium intake to 1,500 mg a day.  Limit alcohol intake to no more than 1 drink a day for nonpregnant women and 2 drinks a day for men. One drink equals 12 oz of beer, 5 oz of wine, or 1 oz of hard liquor.  Work with your health care provider to maintain a healthy body weight or to lose weight. Ask what an ideal weight is for you.  Get at least 30 minutes of exercise that causes your heart to beat faster (aerobic exercise) most days of the week. Activities may include walking, swimming, or biking.  Work with your health care provider or diet and nutrition specialist (dietitian) to adjust your eating plan to your individual calorie needs. Reading food labels  Check food labels for the amount of sodium per serving. Choose foods with less than 5 percent of the Daily Value of sodium. Generally, foods with less than 300 mg of sodium per serving fit into this eating plan.  To find whole grains, look for the word "whole" as the first word in the ingredient list. Shopping  Buy products labeled as "low-sodium" or "no salt added."  Buy fresh foods. Avoid canned foods and premade or frozen meals. Cooking  Avoid adding salt when cooking. Use salt-free seasonings or herbs instead of table salt or sea salt. Check with your health care provider or pharmacist before using salt substitutes.  Do not fry  foods. Cook foods using healthy methods such as baking, boiling, grilling, and broiling instead.  Cook with heart-healthy oils, such as olive, canola, soybean, or sunflower oil. Meal planning   Eat a balanced diet that includes: ? 5 or more servings of fruits and vegetables each day. At each meal, try to fill half of your plate with fruits and vegetables. ? Up to 6-8 servings of whole grains each day. ? Less than 6 oz of lean meat, poultry, or fish each day. A 3-oz serving of meat is about the same size as a deck of cards. One egg equals 1 oz. ? 2 servings of low-fat dairy each day. ? A serving of  nuts, seeds, or beans 5 times each week. ? Heart-healthy fats. Healthy fats called Omega-3 fatty acids are found in foods such as flaxseeds and coldwater fish, like sardines, salmon, and mackerel.  Limit how much you eat of the following: ? Canned or prepackaged foods. ? Food that is high in trans fat, such as fried foods. ? Food that is high in saturated fat, such as fatty meat. ? Sweets, desserts, sugary drinks, and other foods with added sugar. ? Full-fat dairy products.  Do not salt foods before eating.  Try to eat at least 2 vegetarian meals each week.  Eat more home-cooked food and less restaurant, buffet, and fast food.  When eating at a restaurant, ask that your food be prepared with less salt or no salt, if possible. What foods are recommended? The items listed may not be a complete list. Talk with your dietitian about what dietary choices are best for you. Grains Whole-grain or whole-wheat bread. Whole-grain or whole-wheat pasta. Brown rice. [place]. Bulgur. Whole-grain and low-sodium cereals. Pita bread. Low-fat, low-sodium crackers. Whole-wheat flour tortillas. Vegetables Fresh or frozen vegetables (raw, steamed, roasted, or grilled). Low-sodium or reduced-sodium tomato and vegetable juice. Low-sodium or reduced-sodium tomato sauce and tomato paste. Low-sodium or  reduced-sodium canned vegetables. Fruits All fresh, dried, or frozen fruit. Canned fruit in natural juice (without added sugar). Meat and other protein foods Skinless chicken or [place]. Ground chicken or [place]. Pork with fat trimmed off. Fish and seafood. Egg whites. Dried beans, peas, or lentils. Unsalted nuts, nut butters, and seeds. Unsalted canned beans. Lean cuts of beef with fat trimmed off. Low-sodium, lean deli meat. Dairy Low-fat (1%) or fat-free (skim) milk. Fat-free, low-fat, or reduced-fat cheeses. Nonfat, low-sodium ricotta or cottage cheese. Low-fat or nonfat yogurt. Low-fat, low-sodium cheese. Fats and oils Soft margarine without trans fats. Vegetable oil. Low-fat, reduced-fat, or light mayonnaise and salad dressings (reduced-sodium). Canola, safflower, olive, soybean, and sunflower oils. Avocado. Seasoning and other foods Herbs. Spices. Seasoning mixes without salt. Unsalted popcorn and pretzels. Fat-free sweets. What foods are not recommended? The items listed may not be a complete list. Talk with your dietitian about what dietary choices are best for you. Grains Baked goods made with fat, such as croissants, muffins, or some breads. Dry pasta or rice meal packs. Vegetables Creamed or fried vegetables. Vegetables in a cheese sauce. Regular canned vegetables (not low-sodium or reduced-sodium). Regular canned tomato sauce and paste (not low-sodium or reduced-sodium). Regular tomato and vegetable juice (not low-sodium or reduced-sodium). Angie Fava. Olives. Fruits Canned fruit in a light or heavy syrup. Fried fruit. Fruit in cream or butter sauce. Meat and other protein foods Fatty cuts of meat. Ribs. Fried meat. [place] Salines. Sausage. Bologna and other processed lunch meats. Salami. [place]. Hotdogs. Bratwurst. Salted nuts and seeds. Canned beans with added salt. Canned or smoked fish. Whole eggs or egg yolks. Chicken or [place] with skin. Dairy Whole or 2% milk, cream, and half-and-half.  Whole or full-fat cream cheese. Whole-fat or sweetened yogurt. Full-fat cheese. Nondairy creamers. Whipped toppings. Processed cheese and cheese spreads. Fats and oils Butter. Stick margarine. Lard. Shortening. Ghee. Bacon fat. Tropical oils, such as coconut, palm kernel, or palm oil. Seasoning and other foods Salted popcorn and pretzels. Onion salt, garlic salt, seasoned salt, table salt, and sea salt. Worcestershire sauce. Tartar sauce. Barbecue sauce. Teriyaki sauce. Soy sauce, including reduced-sodium. Steak sauce. Canned and packaged gravies. Fish sauce. Oyster sauce. Cocktail sauce. Horseradish that you find on the shelf. Ketchup. Mustard. Meat flavorings  and tenderizers. Bouillon cubes. Hot sauce and Tabasco sauce. Premade or packaged marinades. Premade or packaged taco seasonings. Relishes. Regular salad dressings. Where to find more information:  National Heart, Lung, and [place]: https://wilson-eaton.com/  American Heart Association: www.heart.org Summary  The DASH eating plan is a healthy eating plan that has been shown to reduce high blood pressure (hypertension). It may also reduce your risk for type 2 diabetes, heart disease, and stroke.  With the DASH eating plan, you should limit salt (sodium) intake to 2,300 mg a day. If you have hypertension, you may need to reduce your sodium intake to 1,500 mg a day.  When on the DASH eating plan, aim to eat more fresh fruits and vegetables, whole grains, lean proteins, low-fat dairy, and heart-healthy fats.  Work with your health care provider or diet and nutrition specialist (dietitian) to adjust your eating plan to your individual calorie needs. This information is not intended to replace advice given to you by your health care provider. Make sure you discuss any questions you have with your health care provider. Document Released: 10/06/2011 Document Revised: 10/10/2016 Document Reviewed: 10/10/2016 Elsevier Interactive Patient Education   2017 [employer].

## 2017-04-04 NOTE — Progress Notes (Signed)
6 

## 2017-04-05 LAB — COMPLETE METABOLIC PANEL WITH GFR
ALT: 16 U/L (ref 9–46)
AST: 25 U/L (ref 10–35)
Albumin: 4 g/dL (ref 3.6–5.1)
Alkaline Phosphatase: 71 U/L (ref 40–115)
BILIRUBIN TOTAL: 1.1 mg/dL (ref 0.2–1.2)
BUN: 9 mg/dL (ref 7–25)
CHLORIDE: 100 mmol/L (ref 98–110)
CO2: 21 mmol/L (ref 20–31)
Calcium: 9.6 mg/dL (ref 8.6–10.3)
Creat: 1.14 mg/dL (ref 0.70–1.18)
GFR, EST NON AFRICAN AMERICAN: 61 mL/min (ref >=60)
GFR, Est African American: 70 mL/min (ref >=60)
GLUCOSE: 103 mg/dL — AB (ref 65–99)
POTASSIUM: 4.1 mmol/L (ref 3.5–5.3)
SODIUM: 137 mmol/L (ref 135–146)
TOTAL PROTEIN: 7.7 g/dL (ref 6.1–8.1)

## 2017-06-14 ENCOUNTER — Telehealth: Payer: Self-pay | Admitting: Family Medicine

## 2017-06-14 NOTE — Telephone Encounter (Addendum)
Spoke with wife, Kolbie Lepkowski at length about scheduling patient for evaluation. She states that alcohol and falls have increased since last visit. She will speak with daughter Joelene Millin and call back for evaluation.      Donia Pounds  MSN, FNP-C [place] [address] [place], [place] [address] [phone]

## 2017-07-10 ENCOUNTER — Ambulatory Visit (INDEPENDENT_AMBULATORY_CARE_PROVIDER_SITE_OTHER): Payer: Medicare Other | Admitting: Family Medicine

## 2017-07-10 ENCOUNTER — Encounter: Payer: Self-pay | Admitting: Family Medicine

## 2017-07-10 VITALS — BP 139/71 | HR 106 | Temp 97.6°F | Resp 16 | Ht 72.0 in | Wt 162.0 lb

## 2017-07-10 DIAGNOSIS — I10 Essential (primary) hypertension: Secondary | ICD-10-CM | POA: Diagnosis not present

## 2017-07-10 DIAGNOSIS — Z23 Encounter for immunization: Secondary | ICD-10-CM

## 2017-07-10 DIAGNOSIS — R413 Other amnesia: Secondary | ICD-10-CM | POA: Diagnosis not present

## 2017-07-10 DIAGNOSIS — F1024 Alcohol dependence with alcohol-induced mood disorder: Secondary | ICD-10-CM

## 2017-07-10 LAB — CBC WITH DIFFERENTIAL/PLATELET
BASOS ABS: 53 {cells}/uL (ref 0–200)
Basophils Relative: 0.8 %
EOS ABS: 33 {cells}/uL (ref 15–500)
EOS PCT: 0.5 %
HCT: 43.8 % (ref 38.5–50.0)
Hemoglobin: 15.4 g/dL (ref 13.2–17.1)
LYMPHS ABS: 1815 {cells}/uL (ref 850–3900)
MCH: 35.2 pg — ABNORMAL HIGH (ref 27.0–33.0)
MCHC: 35.2 g/dL (ref 32.0–36.0)
MCV: 100.2 fL — ABNORMAL HIGH (ref 80.0–100.0)
MONOS PCT: 11.7 %
MPV: 9.6 fL (ref 7.5–12.5)
NEUTROS PCT: 59.5 %
Neutro Abs: 3927 cells/uL (ref 1500–7800)
PLATELETS: 204 10*3/uL (ref 140–400)
RBC: 4.37 10*6/uL (ref 4.20–5.80)
RDW: 12.4 % (ref 11.0–15.0)
TOTAL LYMPHOCYTE: 27.5 %
WBC: 6.6 10*3/uL (ref 3.8–10.8)
WBCMIX: 772 {cells}/uL (ref 200–950)

## 2017-07-10 LAB — POCT URINALYSIS DIP (DEVICE)
BILIRUBIN URINE: NEGATIVE
GLUCOSE, UA: NEGATIVE mg/dL
Hgb urine dipstick: NEGATIVE
KETONES UR: NEGATIVE mg/dL
Nitrite: NEGATIVE
PROTEIN: NEGATIVE mg/dL
SPECIFIC GRAVITY, URINE: 1.015 (ref 1.005–1.030)
Urobilinogen, UA: 0.2 mg/dL (ref 0.0–1.0)
pH: 6.5 (ref 5.0–8.0)

## 2017-07-10 LAB — COMPLETE METABOLIC PANEL WITH GFR
AG Ratio: 1.2 (calc) (ref 1.0–2.5)
ALBUMIN MSPROF: 4.2 g/dL (ref 3.6–5.1)
ALKALINE PHOSPHATASE (APISO): 84 U/L (ref 40–115)
ALT: 21 U/L (ref 9–46)
AST: 28 U/L (ref 10–35)
BUN: 11 mg/dL (ref 7–25)
CALCIUM: 9.8 mg/dL (ref 8.6–10.3)
CO2: 27 mmol/L (ref 20–32)
CREATININE: 1.16 mg/dL (ref 0.70–1.18)
Chloride: 100 mmol/L (ref 98–110)
GFR, EST NON AFRICAN AMERICAN: 60 mL/min/{1.73_m2} (ref >=60)
GFR, Est African American: 69 mL/min/{1.73_m2} (ref >=60)
GLOBULIN: 3.6 g/dL (ref 1.9–3.7)
GLUCOSE: 107 mg/dL — AB (ref 65–99)
Potassium: 3.8 mmol/L (ref 3.5–5.3)
Sodium: 135 mmol/L (ref 135–146)
Total Bilirubin: 1.2 mg/dL (ref 0.2–1.2)
Total Protein: 7.8 g/dL (ref 6.1–8.1)

## 2017-07-10 MED ORDER — DONEPEZIL HCL 5 MG PO TABS: 5.0000 mg | ORAL_TABLET | Freq: Every day | ORAL | 1 refills | Status: DC

## 2017-07-10 NOTE — Progress Notes (Signed)
Subjective:    Patient ID: Michael Sharp, male    DOB: [birth year], 82 y.o.   MRN: [id number]  HPI Michael Sharp, a 82 year old male with a history of alcohol uses presents accompanied by daughter Joelene Millin for evaluation and treatment of cognitive problems and daily alcohol use. Received a call from Michael Sharp' wife 2 weeks ago complaining of patient's behaviors. She says that patient drinks alcohol daily and she is no longer able to manage him at home. He has been staying with daughter periodically to assist his wife. He says that he drinks 2-3 beers daily. He does not require an early morning "eye opener". His daughter admits that he starts drinking alcohol early and typically drinks more than 2-3 beers. He often gets agitated when questioned about alcohol use by his wife. He denies feelings of guilt concerning drinking alcohol.   The family and the patient identify problems with changes in short and long term memory, getting disoriented outside of familiar environment and repetition of questions.   Activities of Daily Living (ADLs):   He is independent in the following: ambulation, bathing and hygiene, feeding, continence, grooming, toileting and dressing He does not require assistance    Past Medical History:  Diagnosis Date  . Hypercholesteremia   . Hypertension    Social History   Social History  . Marital status: Married    Spouse name: N/A  . Number of children: N/A  . Years of education: N/A   Occupational History  . Not on file.   Social History Main Topics  . Smoking status: Current Every Day Smoker    Packs/day: 0.50    Years: 60.00    Types: Cigarettes  . 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 Alcohol use 31.2 oz/week    42 Cans of beer, 10 Shots of liquor per week     Comment: "Couple of beers a day" 2 per day   . Drug use: No  . Sexual activity: Not on file   Other Topics Concern  . Not on file   Social History Narrative  . No narrative on file    Immunization History  Administered Date(s) Administered  . Influenza,inj,Quad PF,6+ Mos 10/04/2016, 07/10/2017  . Pneumococcal Conjugate-13 12/18/2014  . Pneumococcal Polysaccharide-23 04/04/2017  . Tdap 01/26/2015   No Known Allergies  Review of Systems  Constitutional: Negative.  Negative for fatigue, fever and unexpected weight change.  HENT: Negative.   Eyes: Negative for photophobia and visual disturbance.  Respiratory: Negative.   Cardiovascular: Negative.   Gastrointestinal: Negative.   Endocrine: Negative for polyphagia and polyuria.  Musculoskeletal: Negative.   Allergic/Immunologic: Negative for immunocompromised state.  Neurological: Positive for tremors.  Hematological: Negative.   Psychiatric/Behavioral: Negative.        Objective:   Physical Exam  Constitutional: He is oriented to person, place, and time.  HENT:  Head: Normocephalic.  Right Ear: External ear normal.  Mouth/Throat: Oropharynx is clear and moist.  Eyes: Pupils are equal, round, and reactive to light. Conjunctivae are normal.  Neck: Normal range of motion. Neck supple.  Abdominal: Soft. Bowel sounds are normal.  Neurological: He is alert and oriented to person, place, and time. He has normal reflexes. He displays tremor.  Skin: Skin is warm and dry.  Psychiatric: He has a normal mood and affect. His behavior is normal. Thought content normal. His mood appears not anxious. His speech is not tangential and not slurred. He is not agitated, not aggressive, not hyperactive, not  slowed, not withdrawn, not actively hallucinating and not combative. He does not exhibit a depressed mood. He expresses no homicidal and no suicidal ideation. He is communicative. He exhibits abnormal remote memory. He is attentive.   MMSE - Mini Mental State Exam 07/10/2017  Orientation to time 4  Orientation to Place 5  Registration 3  Attention/ Calculation 0  Recall 0  Language- name 2 objects 2  Language- repeat 1   Language- follow 3 step command 3  Language- read & follow direction 1  Write a sentence 1  Copy design 1  Total score 21      Assessment & Plan:  1. Alcohol dependence with alcohol-induced mood disorder ([place]) Will continue folic acid, thiamine, and daily multivitamin Provided information for Alcohol and Drug Services Called facility, walk in services offered on M, W, & F from 11 am to 1 pm.  Patient warrants outpatient services to assist with alcohol dependence.  Patient also warrants a psychiatry referral to assess mood disorder associated with alcohol use - Ambulatory referral to Psychiatry  2. Memory loss MMSE consistent with mild cognitive impairment.  Will start a 6 week trial of Aricept 5 mg daily.  Discussed results of MMSE at length Will send a referral to neurology for further workup and evaluation of memory loss.  - donepezil (ARICEPT) 5 MG tablet; Take 1 tablet (5 mg total) by mouth at bedtime.  Dispense: 30 tablet; Refill: 1 - Ambulatory referral to Neurology  3. Essential hypertension Blood pressure is at goal on current medication regimen, no changes warranted.  - COMPLETE METABOLIC PANEL WITH GFR - CBC with Differential  4. Need for immunization against influenza - Flu Vaccine QUAD 36+ mos IM   RTC: Follow up in 6 weeks   Donia Pounds  MSN, FNP-C [facility] [address] [place], [place] [address] [phone]

## 2017-07-10 NOTE — Patient Instructions (Addendum)
1. Memory loss: Will start a trial of Aricept 5 mg daily and follow up in 4 weeks. Referral to neurology 2. Alcohol induced mood disorder: Sent a referral to psychiatry. Also, patient will titrate alcohol use over time. Regulated by family. Recommend that patient follows up with alcohol and drug services.   [place], [place] [address] [phone] 11 am to 1 pm on M, W, F.   Donepezil Oral Dissolving Tablet What is this medicine? DONEPEZIL (doe NEP e zil) is used to treat mild to moderate dementia caused by Alzheimer's disease. This medicine may be used for other purposes; ask your health care provider or pharmacist if you have questions. COMMON BRAND NAME(S): Aricept What should I tell my health care provider before I take this medicine? They need to know if you have any of these conditions: -asthma or other lung disease -difficulty passing urine -head injury -heart disease -history of irregular heartbeat -liver disease -seizures (convulsions) -stomach or intestinal disease, ulcers or stomach bleeding -an unusual or allergic reaction to donepezil, other medicines, foods, dyes, or preservatives -pregnant or trying to get pregnant -breast-feeding How should I use this medicine? Take this medicine by mouth. Follow the directions on the prescription label. Place the tablet in the mouth and allow it to dissolve, then swallow. While you may take these tablets with water, it is not necessary to do so. You may take this medicine with or without food. Take your doses at regular intervals. This medicine is usually taken before bedtime. Do not take your medicine more often than directed. Continue to take your medicine even if you feel better. Do not stop taking except on the advice of your doctor or health care professional. Talk to your pediatrician regarding the use of this medicine in children. Special care may be needed. Overdosage: If you think you have taken too much of this  medicine contact a poison control center or emergency room at once. NOTE: This medicine is only for you. Do not share this medicine with others. What if I miss a dose? If you miss a dose, take it as soon as you can. If it is almost time for your next dose, take only that dose. Do not take double or extra doses. What may interact with this medicine? Do not take this medicine with any of the following medications: -certain medicines for fungal infections like itraconazole, fluconazole, posaconazole, and voriconazole -cisapride -dextromethorphan; quinidine -dofetilide -dronedarone -pimozide -quinidine -thioridazine -ziprasidone This medicine may also interact with the following medications: -antihistamines for allergy, cough and cold -atropine -bethanechol -carbamazepine -certain medicines for bladder problems like oxybutynin, tolterodine -certain medicines for Parkinson's disease like benztropine, trihexyphenidyl -certain medicines for stomach problems like dicyclomine, hyoscyamine -certain medicines for travel sickness like scopolamine -dexamethasone -ipratropium -NSAIDs, medicines for pain and inflammation, like ibuprofen or naproxen -other medicines for Alzheimer's disease -other medicines that prolong the QT interval (cause an abnormal heart rhythm) -phenobarbital -phenytoin -rifampin, rifabutin or rifapentine This list may not describe all possible interactions. Give your health care provider a list of all the medicines, herbs, non-prescription drugs, or dietary supplements you use. Also tell them if you smoke, drink alcohol, or use illegal drugs. Some items may interact with your medicine. What should I watch for while using this medicine? Visit your doctor or health care professional for regular checks on your progress. Check with your doctor or health care professional if your symptoms do not get better or if they get worse. You may get drowsy  or dizzy. Do not drive, use  machinery, or do anything that needs mental alertness until you know how this drug affects you. What side effects may I notice from receiving this medicine? Side effects that you should report to your doctor or health care professional as soon as possible: -allergic reactions like skin rash, itching or hives, swelling of the face, lips, or tongue -feeling faint or lightheaded, falls -loss of bladder control -seizures -signs and symptoms of a dangerous change in heartbeat or heart rhythm like chest pain; dizziness; fast or irregular heartbeat; palpitations; feeling faint or lightheaded, falls; breathing problems -signs and symptoms of infection like fever or chills; cough; sore throat; pain or trouble passing urine -signs and symptoms of liver injury like dark yellow or brown urine; general ill feeling or flu-like symptoms; light-colored stools; loss of appetite; nausea; right upper belly pain; unusually weak or tired; yellowing of the eyes or skin -slow heartbeat or palpitations -unusual bleeding or bruising -vomiting Side effects that usually do not require medical attention (report to your doctor or health care professional if they continue or are bothersome): -diarrhea, especially when starting treatment -headache -loss of appetite -muscle cramps -nausea -stomach upset This list may not describe all possible side effects. Call your doctor for medical advice about side effects. You may report side effects to FDA at 1-800-FDA-1088. Where should I keep my medicine? Keep out of reach of children. Store at room temperature between 15 and 30 degrees C (59 and 86 degrees F). Throw away any unused medicine after the expiration date. NOTE: This sheet is a summary. It may not cover all possible information. If you have questions about this medicine, talk to your doctor, pharmacist, or health care provider.  2018 Elsevier/Gold Standard (2016-04-04 20:50:58)

## 2017-07-10 NOTE — Progress Notes (Deleted)
Subjective:    Patient ID: Michael Sharp, male    DOB: 10-27-1935, 82 y.o.   MRN: [id number]  HPI   Michael Sharp is a 82 y.o. male patient consulted for Alcohol Intoxication.    2 beers per day Every day,  No early morning eye opener Patient becomes agitated or d  Patient's wife       Past Medical History:  Diagnosis Date  . Hypercholesteremia   . Hypertension    Past Surgical History:  Procedure Laterality Date  . ACHILLES TENDON REPAIR    . HERNIA REPAIR      reports that he has been smoking Cigarettes.  He has a 30.00 pack-year smoking history. He has quit using smokeless tobacco. He reports that he drinks about 31.2 oz of alcohol per week . He reports that he does not use drugs. Family History  Problem Relation Age of Onset  . Cancer Mother   . Cancer Father    Allergies:  No Known Allergies  Objective: Blood pressure 139/71, pulse (!) 106, temperature 97.6 F (36.4 C), temperature source Oral, resp. rate 16, height 6' (1.829 m), weight 162 lb (73.5 kg), SpO2 100 %.Body mass index is 21.97 kg/m. Results for orders placed or performed in visit on 07/10/17 (from the past 72 hour(s))  POCT urinalysis dip (device)     Status: Abnormal   Collection Time: 07/10/17  8:50 AM  Result Value Ref Range   Glucose, UA NEGATIVE NEGATIVE mg/dL   Bilirubin Urine NEGATIVE NEGATIVE   Ketones, ur NEGATIVE NEGATIVE mg/dL   Specific Gravity, Urine 1.015 1.005 - 1.030   Hgb urine dipstick NEGATIVE NEGATIVE   pH 6.5 5.0 - 8.0   Protein, ur NEGATIVE NEGATIVE mg/dL   Urobilinogen, UA 0.2 0.0 - 1.0 mg/dL   Nitrite NEGATIVE NEGATIVE   Leukocytes, UA TRACE (A) NEGATIVE    Comment: Biochemical Testing Only. Please order routine urinalysis from main lab if confirmatory testing is needed.    Current Outpatient Prescriptions  Medication Sig Dispense Refill  . amLODipine (NORVASC) 10 MG tablet Take 1 tablet (10 mg total) by mouth daily. 90 tablet 1  . atorvastatin (LIPITOR) 20  MG tablet Take 1 tablet (20 mg total) by mouth daily. 90 tablet 3  . folic acid (FOLVITE) 1 MG tablet Take 1 tablet (1 mg total) by mouth daily. 90 tablet 1  . gabapentin (NEURONTIN) 300 MG capsule Take 1 capsule (300 mg total) by mouth at bedtime. 90 capsule 3  . hydrochlorothiazide (HYDRODIURIL) 25 MG tablet Take 1 tablet (25 mg total) by mouth daily. 90 tablet 3  . Multiple Vitamin (MULTIVITAMIN) capsule Take 1 capsule by mouth daily.    Marland Kitchen donepezil (ARICEPT) 5 MG tablet Take 1 tablet (5 mg total) by mouth at bedtime. 30 tablet 1   No current facility-administered medications for this visit.    Physical Exam  Constitutional: He is oriented to person, place, and time. He appears well-developed and well-nourished.  HENT:  Head: Normocephalic and atraumatic.  Right Ear: External ear normal.  Left Ear: External ear normal.  Mouth/Throat: Oropharynx is clear and moist.  Eyes: Pupils are equal, round, and reactive to light. Conjunctivae and EOM are normal.  Neck: Normal range of motion. Neck supple. No tracheal deviation present. No thyromegaly present.  Cardiovascular: Normal rate, regular rhythm, normal heart sounds and intact distal pulses.   Respiratory: Effort normal and breath sounds normal.  GI: Soft. Bowel sounds are normal.  Neurological: He is  alert and oriented to person, place, and time. He has normal reflexes.  Skin: Skin is warm and dry.  Psychiatric: He has a normal mood and affect. His behavior is normal. Judgment and thought content normal.    Review of Systems  Constitutional: Negative.   HENT: Negative.     Body mass index is 21.97 kg/m.  General Appearance: {Appearance:22683}  Eye Contact::  {BHH EYE CONTACT:22684}  Speech:  {Speech:22685}  Volume:  {Volume (PAA):22686}  Mood:  {BHH MOOD:22306}  Affect:  {Affect (PAA):22687}  Thought Process:  {Thought Process (PAA):22688}  Orientation:  {BHH ORIENTATION (PAA):22689}  Thought Content:    Homicidal Thoughts:   {ST/HT (PAA):22692}  Memory:  {BHH MEMORY:22881}  Judgement:  {Judgement (PAA):22694}  Insight:  {Insight (PAA):22695}  Psychomotor Activity:  {Psychomotor (PAA):22696}  Concentration:  {BHH GOOD/FAIR/POOR:22877}  Recall:  {BHH GOOD/FAIR/POOR:22877}  Akathisia:  {BHH YES OR NO:22294}  Handed:  {Handed:22697}  AIMS (if indicated):     Assets:  {Assets (PAA):22698}  Sleep:     2 beers per day Every day,  No early morning eye opener Patient becomes agitated or d  Patient's wife   Long term memory loss.  Wife typically drives from place to place.    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    No headaches or blurred vision Memory worsening      BP 139/71 (BP Location: Left Arm, Patient Position: Sitting, Cuff Size: Normal)   Pulse (!) 106   Temp 97.6 F (36.4 C) (Oral)   Resp 16   Ht 6' (1.829 m)   Wt 162 lb (73.5 kg)   SpO2 100%   BMI 21.97 kg/m   Past Medical History:  Diagnosis Date  . Hypercholesteremia   . Hypertension    Immunization History  Administered Date(s) Administered  . Influenza,inj,Quad PF,6+ Mos 10/04/2016  . Pneumococcal Conjugate-13 12/18/2014  . Pneumococcal Polysaccharide-23 04/04/2017  . Tdap 01/26/2015   Review of Systems  Constitutional: Negative.   HENT: Negative.        Objective:   Physical Exam  Constitutional: He is oriented to person, place, and time. He appears well-developed and well-nourished.  HENT:  Head: Normocephalic and atraumatic.  Right Ear: External ear normal.  Left Ear: External ear normal.  Mouth/Throat: Oropharynx is clear and moist.  Eyes: Pupils are equal, round, and reactive to light. Conjunctivae and EOM are normal.  Neck: Normal range of motion. Neck supple. No tracheal deviation present. No thyromegaly present.  Cardiovascular: Normal rate, regular rhythm, normal heart sounds and intact distal pulses.   Pulmonary/Chest: Effort normal and breath sounds normal.  Abdominal: Soft. Bowel  sounds are normal.  Neurological: He is alert and oriented to person, place, and time. He has normal reflexes.  Skin: Skin is warm and dry.  Psychiatric: He has a normal mood and affect. His behavior is normal. Judgment and thought content normal.     BP 139/71 (BP Location: Left Arm, Patient Position: Sitting, Cuff Size: Normal)   Pulse (!) 106   Temp 97.6 F (36.4 C) (Oral)   Resp 16   Ht 6' (1.829 m)   Wt 162 lb (73.5 kg)   SpO2 100%   BMI 21.97 kg/m  Assessment & Plan:  No evidence of imminent risk to self or others at present.   Patient does not meet criteria for psychiatric inpatient admission. Supportive therapy provided about ongoing stressors. Discussed crisis plan, support from social network, calling 911, coming to the Emergency Department, and calling  Suicide Hotline.

## 2017-07-12 ENCOUNTER — Telehealth (HOSPITAL_COMMUNITY): Payer: Self-pay

## 2017-07-25 DIAGNOSIS — D2371 Other benign neoplasm of skin of right lower limb, including hip: Secondary | ICD-10-CM | POA: Diagnosis not present

## 2017-08-07 ENCOUNTER — Ambulatory Visit (INDEPENDENT_AMBULATORY_CARE_PROVIDER_SITE_OTHER): Payer: Medicare Other | Admitting: Family Medicine

## 2017-08-07 ENCOUNTER — Encounter: Payer: Self-pay | Admitting: Family Medicine

## 2017-08-07 VITALS — BP 166/78 | HR 102 | Temp 98.2°F | Resp 16 | Ht 72.0 in | Wt 163.0 lb

## 2017-08-07 DIAGNOSIS — F1024 Alcohol dependence with alcohol-induced mood disorder: Secondary | ICD-10-CM

## 2017-08-07 DIAGNOSIS — I10 Essential (primary) hypertension: Secondary | ICD-10-CM

## 2017-08-07 NOTE — Progress Notes (Signed)
Subjective:    Patient ID: Michael Sharp, male    DOB: [birth year], 82 y.o.   MRN: [id number]  HPI Michael Sharp, a 82 year old male with a history of alcohol uses presents accompanied by wife for evaluation and treatment of cognitive problems and daily alcohol use. Mr. Fambro' wife continues to complain of daily alcohol use and behavorial changes.   She says that patient drinks alcohol daily and she is no longer able to manage him at home. He has been staying with daughter periodically to assist his wife. He says that he drinks 2-3 beers daily. His wife says that he drinks [employer] [place] [employer] and [employer]. He does not require an early morning "eye opener". His wife says that he starts drinking alcohol early and typically drinks more than 2-3 beers. He often gets agitated when questioned about alcohol use by his wife. He denies feelings of guilt concerning drinking alcohol.   The family and the patient identify problems with changes in short and long term memory, getting disoriented outside of familiar environment and repetition of questions.   Activities of Daily Living (ADLs):   He is independent in the following: ambulation, bathing and hygiene, feeding, continence, grooming, toileting and dressing He does not require assistance    Past Medical History:  Diagnosis Date  . Hypercholesteremia   . Hypertension    Social History   Social History  . Marital status: Married    Spouse name: N/A  . Number of children: N/A  . Years of education: N/A   Occupational History  . Not on file.   Social History Main Topics  . Smoking status: Current Every Day Smoker    Packs/day: 0.50    Years: 60.00    Types: Cigarettes  . 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 Alcohol use 31.2 oz/week    42 Cans of beer, 10 Shots of liquor per week     Comment: "Couple of beers a day" 2 per day   . Drug use: No  . Sexual activity: Not on file   Other Topics Concern  . Not on file   Social History  Narrative  . No narrative on file   Immunization History  Administered Date(s) Administered  . Influenza,inj,Quad PF,6+ Mos 10/04/2016, 07/10/2017  . Pneumococcal Conjugate-13 12/18/2014  . Pneumococcal Polysaccharide-23 04/04/2017  . Tdap 01/26/2015   No Known Allergies  Review of Systems  Constitutional: Negative.  Negative for fatigue, fever and unexpected weight change.  HENT: Negative.   Eyes: Negative for photophobia and visual disturbance.  Respiratory: Negative.   Cardiovascular: Negative.   Gastrointestinal: Negative.   Endocrine: Negative for polyphagia and polyuria.  Musculoskeletal: Negative.   Allergic/Immunologic: Negative for immunocompromised state.  Neurological: Positive for tremors.  Hematological: Negative.   Psychiatric/Behavioral: Negative.        Objective:   Physical Exam  Constitutional: He is oriented to person, place, and time.  HENT:  Head: Normocephalic.  Right Ear: External ear normal.  Mouth/Throat: Oropharynx is clear and moist.  Eyes: Pupils are equal, round, and reactive to light. Conjunctivae are normal.  Neck: Normal range of motion. Neck supple.  Abdominal: Soft. Bowel sounds are normal.  Neurological: He is alert and oriented to person, place, and time. He has normal reflexes. He displays tremor.  Skin: Skin is warm and dry.  Psychiatric: He has a normal mood and affect. His behavior is normal. Thought content normal. His mood appears not anxious. His speech is not tangential and  not slurred. He is not agitated, not aggressive, not hyperactive, not slowed, not withdrawn, not actively hallucinating and not combative. He does not exhibit a depressed mood. He expresses no homicidal and no suicidal ideation. He is communicative. He exhibits abnormal remote memory. He is attentive.   MMSE - Mini Mental State Exam 08/07/2017 07/10/2017  Orientation to time 4 4  Orientation to Place 5 5  Registration 3 3  Attention/ Calculation 0 0  Recall 3  0  Language- name 2 objects 2 2  Language- repeat 1 1  Language- follow 3 step command 3 3  Language- read & follow direction 1 1  Write a sentence 1 1  Copy design 1 1  Total score 24 21      Assessment & Plan:  1. Alcohol dependence with alcohol-induced mood disorder ([place]) Will continue folic acid, thiamine, and daily multivitamin Provided information for Alcohol and Drug Services Called facility, walk in services offered on M, W, & F from 11 am to 1 pm.  Patient warrants outpatient services to assist with alcohol dependence.  Patient also warrants a psychiatry referral to assess mood disorder associated with alcohol use - Ambulatory referral to Psychiatry  2. Memory loss MMSE improved from previous visit Will continue Aricept at 5 mg daily Discussed results of MMSE at length An appointment has been scheduled with neurology for further workup and evaluation of memory loss.  - donepezil (ARICEPT) 5 MG tablet; Take 1 tablet (5 mg total) by mouth at bedtime.  Dispense: 30 tablet; Refill: 1   3. Essential hypertension Blood pressure is elevated, Mr. Gerstner did not take medication prior to arrival  Will return in 1 week for a blood pressure check   RTC: Follow up as scheduled on 10/05/2017 for Medicare Wellness visit   Donia Pounds  MSN, FNP-C [facility] [address] [place], [place] [address] [phone]

## 2017-08-07 NOTE — Patient Instructions (Addendum)
1. Memory loss: Will continue Aricept 5 mg daily and follow up with neurology as scheduled.    2. Alcohol induced mood disorder: Sent a referral to psychiatry. Also, patient will titrate alcohol use over time. Regulated by family. Recommend that patient follows up with alcohol and drug services.   [place], [place] [address] [phone] 11 am to 1 pm on M, W, F.

## 2017-08-22 ENCOUNTER — Encounter (HOSPITAL_COMMUNITY): Payer: Self-pay | Admitting: Emergency Medicine

## 2017-08-22 ENCOUNTER — Emergency Department (HOSPITAL_COMMUNITY)
Admission: EM | Admit: 2017-08-22 | Discharge: 2017-08-24 | Disposition: A | Discharge status: Home / Self Care | Payer: Medicare Other | Attending: Emergency Medicine | Admitting: Emergency Medicine

## 2017-08-22 DIAGNOSIS — F0390 Unspecified dementia without behavioral disturbance: Secondary | ICD-10-CM | POA: Diagnosis not present

## 2017-08-22 DIAGNOSIS — F0391 Unspecified dementia with behavioral disturbance: Secondary | ICD-10-CM | POA: Diagnosis not present

## 2017-08-22 DIAGNOSIS — R918 Other nonspecific abnormal finding of lung field: Secondary | ICD-10-CM | POA: Diagnosis not present

## 2017-08-22 DIAGNOSIS — F17210 Nicotine dependence, cigarettes, uncomplicated: Secondary | ICD-10-CM | POA: Insufficient documentation

## 2017-08-22 DIAGNOSIS — I10 Essential (primary) hypertension: Secondary | ICD-10-CM | POA: Diagnosis not present

## 2017-08-22 DIAGNOSIS — R45850 Homicidal ideations: Secondary | ICD-10-CM | POA: Diagnosis not present

## 2017-08-22 DIAGNOSIS — Z046 Encounter for general psychiatric examination, requested by authority: Secondary | ICD-10-CM | POA: Insufficient documentation

## 2017-08-22 DIAGNOSIS — Z79899 Other long term (current) drug therapy: Secondary | ICD-10-CM | POA: Diagnosis not present

## 2017-08-22 DIAGNOSIS — Z9114 Patient's other noncompliance with medication regimen: Secondary | ICD-10-CM | POA: Diagnosis not present

## 2017-08-22 DIAGNOSIS — F1024 Alcohol dependence with alcohol-induced mood disorder: Secondary | ICD-10-CM | POA: Diagnosis present

## 2017-08-22 DIAGNOSIS — F0281 Dementia in other diseases classified elsewhere with behavioral disturbance: Secondary | ICD-10-CM | POA: Diagnosis not present

## 2017-08-22 DIAGNOSIS — F03918 Unspecified dementia, unspecified severity, with other behavioral disturbance: Secondary | ICD-10-CM | POA: Diagnosis present

## 2017-08-22 DIAGNOSIS — G309 Alzheimer's disease, unspecified: Secondary | ICD-10-CM | POA: Diagnosis not present

## 2017-08-22 DIAGNOSIS — R455 Hostility: Secondary | ICD-10-CM | POA: Diagnosis present

## 2017-08-22 DIAGNOSIS — F1094 Alcohol use, unspecified with alcohol-induced mood disorder: Secondary | ICD-10-CM | POA: Diagnosis not present

## 2017-08-22 DIAGNOSIS — Z0000 Encounter for general adult medical examination without abnormal findings: Secondary | ICD-10-CM

## 2017-08-22 LAB — CBC WITH DIFFERENTIAL/PLATELET
BASOS ABS: 0 10*3/uL (ref 0.0–0.1)
BASOS PCT: 0 %
EOS ABS: 0 10*3/uL (ref 0.0–0.7)
Eosinophils Relative: 0 %
HCT: 43.2 % (ref 39.0–52.0)
HEMOGLOBIN: 15.3 g/dL (ref 13.0–17.0)
Lymphocytes Relative: 38 %
Lymphs Abs: 2.5 10*3/uL (ref 0.7–4.0)
MCH: 35.3 pg — ABNORMAL HIGH (ref 26.0–34.0)
MCHC: 35.4 g/dL (ref 30.0–36.0)
MCV: 99.8 fL (ref 78.0–100.0)
MONOS PCT: 9 %
Monocytes Absolute: 0.6 10*3/uL (ref 0.1–1.0)
NEUTROS ABS: 3.5 10*3/uL (ref 1.7–7.7)
NEUTROS PCT: 53 %
Platelets: 178 10*3/uL (ref 150–400)
RBC: 4.33 MIL/uL (ref 4.22–5.81)
RDW: 13.7 % (ref 11.5–15.5)
WBC: 6.7 10*3/uL (ref 4.0–10.5)

## 2017-08-22 LAB — COMPREHENSIVE METABOLIC PANEL
ALK PHOS: 76 U/L (ref 38–126)
ALT: 26 U/L (ref 17–63)
AST: 38 U/L (ref 15–41)
Albumin: 4.1 g/dL (ref 3.5–5.0)
Anion gap: 13 (ref 5–15)
BUN: 13 mg/dL (ref 6–20)
CALCIUM: 9.6 mg/dL (ref 8.9–10.3)
CO2: 23 mmol/L (ref 22–32)
CREATININE: 0.98 mg/dL (ref 0.61–1.24)
Chloride: 101 mmol/L (ref 101–111)
Glucose, Bld: 94 mg/dL (ref 65–99)
Potassium: 3.4 mmol/L — ABNORMAL LOW (ref 3.5–5.1)
Sodium: 137 mmol/L (ref 135–145)
Total Bilirubin: 0.7 mg/dL (ref 0.3–1.2)
Total Protein: 8.1 g/dL (ref 6.5–8.1)

## 2017-08-22 LAB — RAPID URINE DRUG SCREEN, HOSP PERFORMED
AMPHETAMINES: NOT DETECTED
Barbiturates: NOT DETECTED
Benzodiazepines: NOT DETECTED
Cocaine: NOT DETECTED
OPIATES: NOT DETECTED
TETRAHYDROCANNABINOL: NOT DETECTED

## 2017-08-22 LAB — ETHANOL: ALCOHOL ETHYL (B): 83 mg/dL — AB (ref <10)

## 2017-08-22 MED ORDER — LORAZEPAM 2 MG/ML IJ SOLN: 0.0000 mg | Freq: Two times a day (BID) | INTRAMUSCULAR | Status: DC

## 2017-08-22 MED ORDER — ATORVASTATIN CALCIUM 20 MG PO TABS
20.0000 mg | ORAL_TABLET | Freq: Every day | ORAL | Status: DC
  Administered 2017-08-22 – 2017-08-24 (×3): 20 mg via ORAL
  Filled 2017-08-22 (×3): qty 1

## 2017-08-22 MED ORDER — THIAMINE HCL 100 MG/ML IJ SOLN: 100.0000 mg | Freq: Every day | INTRAMUSCULAR | Status: DC

## 2017-08-22 MED ORDER — POTASSIUM CHLORIDE CRYS ER 20 MEQ PO TBCR
20.0000 meq | EXTENDED_RELEASE_TABLET | Freq: Once | ORAL | Status: AC
  Administered 2017-08-22: 20 meq via ORAL
  Filled 2017-08-22: qty 1

## 2017-08-22 MED ORDER — HYDROCHLOROTHIAZIDE 25 MG PO TABS
25.0000 mg | ORAL_TABLET | Freq: Every day | ORAL | Status: DC
  Administered 2017-08-22 – 2017-08-24 (×3): 25 mg via ORAL
  Filled 2017-08-22 (×3): qty 1

## 2017-08-22 MED ORDER — VITAMIN B-1 100 MG PO TABS
100.0000 mg | ORAL_TABLET | Freq: Every day | ORAL | Status: DC
  Administered 2017-08-22 – 2017-08-24 (×3): 100 mg via ORAL
  Filled 2017-08-22 (×3): qty 1

## 2017-08-22 MED ORDER — AMLODIPINE BESYLATE 5 MG PO TABS
10.0000 mg | ORAL_TABLET | Freq: Every day | ORAL | Status: DC
  Administered 2017-08-22 – 2017-08-24 (×3): 10 mg via ORAL
  Filled 2017-08-22 (×3): qty 2

## 2017-08-22 MED ORDER — DONEPEZIL HCL 5 MG PO TABS
5.0000 mg | ORAL_TABLET | Freq: Every day | ORAL | Status: DC
  Administered 2017-08-22 – 2017-08-23 (×2): 5 mg via ORAL
  Filled 2017-08-22 (×2): qty 1

## 2017-08-22 MED ORDER — LORAZEPAM 2 MG/ML IJ SOLN: 0.0000 mg | Freq: Four times a day (QID) | INTRAMUSCULAR | Status: DC

## 2017-08-22 MED ORDER — GABAPENTIN 300 MG PO CAPS
300.0000 mg | ORAL_CAPSULE | Freq: Every day | ORAL | Status: DC
Start: 2017-08-22 — End: 2017-08-24
  Administered 2017-08-22 – 2017-08-23 (×2): 300 mg via ORAL
  Filled 2017-08-22 (×2): qty 1

## 2017-08-22 MED ORDER — LORAZEPAM 1 MG PO TABS: 0.0000 mg | ORAL_TABLET | Freq: Four times a day (QID) | ORAL | Status: DC

## 2017-08-22 MED ORDER — LORAZEPAM 1 MG PO TABS
0.0000 mg | ORAL_TABLET | Freq: Two times a day (BID) | ORAL | Status: DC
Start: 2017-08-25 — End: 2017-08-24
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
## 2017-08-22 NOTE — ED Notes (Signed)
Bed: WA30 Expected date:  Expected time:  Means of arrival:  Comments: 
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
## 2017-08-23 ENCOUNTER — Emergency Department (HOSPITAL_COMMUNITY): Payer: Medicare Other

## 2017-08-23 DIAGNOSIS — F1094 Alcohol use, unspecified with alcohol-induced mood disorder: Secondary | ICD-10-CM | POA: Diagnosis not present

## 2017-08-23 DIAGNOSIS — R45850 Homicidal ideations: Secondary | ICD-10-CM

## 2017-08-23 DIAGNOSIS — F17210 Nicotine dependence, cigarettes, uncomplicated: Secondary | ICD-10-CM

## 2017-08-23 DIAGNOSIS — F0391 Unspecified dementia with behavioral disturbance: Secondary | ICD-10-CM | POA: Diagnosis not present

## 2017-08-23 DIAGNOSIS — G309 Alzheimer's disease, unspecified: Secondary | ICD-10-CM

## 2017-08-23 DIAGNOSIS — F1910 Other psychoactive substance abuse, uncomplicated: Secondary | ICD-10-CM

## 2017-08-23 DIAGNOSIS — F03918 Unspecified dementia, unspecified severity, with other behavioral disturbance: Secondary | ICD-10-CM | POA: Diagnosis present

## 2017-08-23 DIAGNOSIS — F0281 Dementia in other diseases classified elsewhere with behavioral disturbance: Secondary | ICD-10-CM

## 2017-08-23 DIAGNOSIS — R918 Other nonspecific abnormal finding of lung field: Secondary | ICD-10-CM | POA: Diagnosis not present

## 2017-08-23 LAB — URINALYSIS, ROUTINE W REFLEX MICROSCOPIC
BILIRUBIN URINE: NEGATIVE
GLUCOSE, UA: NEGATIVE mg/dL
HGB URINE DIPSTICK: NEGATIVE
KETONES UR: NEGATIVE mg/dL
LEUKOCYTES UA: NEGATIVE
Nitrite: NEGATIVE
PH: 8 (ref 5.0–8.0)
Protein, ur: NEGATIVE mg/dL
Specific Gravity, Urine: 1.008 (ref 1.005–1.030)

## 2017-08-23 NOTE — ED Notes (Signed)
No respiratory or acute distress noted alert and oriented call light in reach hospital sitter watching pt.

## 2017-08-23 NOTE — Progress Notes (Signed)
CSW received call from [facility] in [place]- they have declined patient at this time.   Kingsley Spittle, [facility] Emergency Room Clinical Social Worker [phone]
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
## 2017-08-23 NOTE — BHH Counselor (Signed)
Patient wife called to check on his status. 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

## 2017-08-23 NOTE — BH Assessment (Addendum)
[facility] Assessment Progress Note  Per Buford Dresser, DO, this pt requires psychiatric hospitalization at this time.  Pt presents under IVC initiated by pt's wife, which Dr Mariea Clonts has upheld.  The following facilities have been contacted to seek placement for this pt, with results as noted:  Beds available, information sent, decision pending:  [place]:  Virgel Bouquet [place], [place] Triage Specialist [phone]   Addendum:  Pt has been declined by [facility]; they find that pt is not suitable for their program.  Jalene Mullet, [place] Triage Specialist [phone]

## 2017-08-23 NOTE — ED Notes (Signed)
Pt states he feels good no shaking no n/v voiced alert and oriented call light in reach no reaction to medication noted.

## 2017-08-24 DIAGNOSIS — G309 Alzheimer's disease, unspecified: Secondary | ICD-10-CM | POA: Diagnosis not present

## 2017-08-24 DIAGNOSIS — F17210 Nicotine dependence, cigarettes, uncomplicated: Secondary | ICD-10-CM | POA: Diagnosis not present

## 2017-08-24 DIAGNOSIS — F1910 Other psychoactive substance abuse, uncomplicated: Secondary | ICD-10-CM | POA: Diagnosis not present

## 2017-08-24 DIAGNOSIS — F1024 Alcohol dependence with alcohol-induced mood disorder: Secondary | ICD-10-CM

## 2017-08-24 DIAGNOSIS — F1094 Alcohol use, unspecified with alcohol-induced mood disorder: Secondary | ICD-10-CM | POA: Diagnosis not present

## 2017-08-24 DIAGNOSIS — F0391 Unspecified dementia with behavioral disturbance: Secondary | ICD-10-CM | POA: Diagnosis not present

## 2017-08-24 DIAGNOSIS — F0281 Dementia in other diseases classified elsewhere with behavioral disturbance: Secondary | ICD-10-CM | POA: Diagnosis not present

## 2017-08-24 DIAGNOSIS — R45850 Homicidal ideations: Secondary | ICD-10-CM | POA: Diagnosis not present
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
## 2017-08-24 NOTE — BH Assessment (Signed)
[facility] Assessment Progress Note  Per Buford Dresser, DO, this pt does not require psychiatric hospitalization at this time.  Pt presents under IVC initiated by pt's wife and upheld by Dr Mariea Clonts, which she has now rescinded.  Pt is to be discharged from [facility] with recommendation to follow up with the [facility].  This has been included in pt's discharge instructions.  Pt's nurse has been notified.  Jalene Mullet, [place] Triage Specialist [phone]
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
## 2017-08-24 NOTE — Discharge Instructions (Signed)
For your behavioral health needs, you are advised to follow up with the [place]:       [facility]      [place], [place] [address]      [phone]

## 2017-08-24 NOTE — ED Notes (Signed)
No respiratory or acute distress noted call light in reach no reaction to medication noted hospital sitter watching pt.

## 2017-08-24 NOTE — ED Notes (Signed)
No respiratory or acute distress noted resting in bed with eyes closed call light in reach hospital sitter watching pt.

## 2017-08-30 ENCOUNTER — Encounter: Payer: Self-pay | Admitting: Neurology

## 2017-08-30 ENCOUNTER — Ambulatory Visit (INDEPENDENT_AMBULATORY_CARE_PROVIDER_SITE_OTHER): Payer: Medicare Other | Admitting: Neurology

## 2017-08-30 VITALS — BP 138/71 | HR 71 | Wt 167.0 lb

## 2017-08-30 DIAGNOSIS — R413 Other amnesia: Secondary | ICD-10-CM | POA: Diagnosis not present

## 2017-08-30 DIAGNOSIS — E519 Thiamine deficiency, unspecified: Secondary | ICD-10-CM

## 2017-08-30 DIAGNOSIS — F0281 Dementia in other diseases classified elsewhere with behavioral disturbance: Secondary | ICD-10-CM | POA: Diagnosis not present

## 2017-08-30 DIAGNOSIS — F1020 Alcohol dependence, uncomplicated: Secondary | ICD-10-CM

## 2017-08-30 DIAGNOSIS — F02818 Dementia in other diseases classified elsewhere, unspecified severity, with other behavioral disturbance: Secondary | ICD-10-CM

## 2017-08-30 DIAGNOSIS — F1027 Alcohol dependence with alcohol-induced persisting dementia: Secondary | ICD-10-CM | POA: Diagnosis not present

## 2017-08-30 DIAGNOSIS — E538 Deficiency of other specified B group vitamins: Secondary | ICD-10-CM | POA: Diagnosis not present

## 2017-08-30 NOTE — Patient Instructions (Signed)
Dementia Dementia is the loss of two or more brain functions, such as:  Memory.  Decision making.  Behavior.  Speaking.  Thinking.  Problem solving.  There are many types of dementia. The most common type is called progressive dementia. Progressive dementia gets worse with time and it is irreversible. An example of this type of dementia is Alzheimer disease. What are the causes? This condition may be caused by:  Nerve cell damage in the brain.  Genetic mutations.  Certain medicines.  Multiple small strokes.  An infection, such as chronic meningitis.  A metabolic problem, such as vitamin B12 deficiency or thyroid disease.  Pressure on the brain, such as from a tumor or blood clot.  What are the signs or symptoms? Symptoms of this condition include:  Sudden changes in mood.  Depression.  Problems with balance.  Changes in personality.  Poor short-term memory.  Agitation.  Delusions.  Hallucinations.  Having a hard time: ? Speaking thoughts. ? Finding words. ? Solving problems. ? Doing familiar tasks. ? Understanding familiar ideas.  How is this diagnosed? This condition is diagnosed with an assessment by your health care provider. During this assessment, your health care provider will talk with you and your family, friends, or caregivers about your symptoms. A thorough medical history will be taken, and you will have a physical exam and tests. Tests may include:  Lab tests, such as blood or urine tests.  Imaging tests, such as a CT scan, PET scan, or MRI.  A lumbar puncture. This test involves removing and testing a small amount of the fluid that surrounds the brain and spinal cord.  An electroencephalogram (EEG). In this test, small metal discs are used to measure electrical activity in the brain.  Memory tests, cognitive tests, and neuropsychological tests. These tests evaluate brain function.  How is this treated? Treatment depends on the  cause of the dementia. It may involve taking medicines that may help:  To control the dementia.  To slow down the disease.  To manage symptoms.  In some cases, treating the cause of the dementia can improve symptoms, reverse symptoms, or slow down how quickly the dementia gets worse. Your health care provider can help direct you to support groups, organizations, and other health care providers who can help with decisions about your care. Follow these instructions at home: Medicine  Take over-the-counter and prescription medicines only as told by your health care provider.  Avoid taking medicines that can affect thinking, such as pain or sleeping medicines. Lifestyle   Make healthy lifestyle choices: ? Be physically active as told by your health care provider. ? Do not use any tobacco products, such as cigarettes, chewing tobacco, and e-cigarettes. If you need help quitting, ask your health care provider. ? Eat a healthy diet. ? Practice stress-management techniques when you get stressed. ? Stay social.  Drink enough fluid to keep your urine clear or pale yellow.  Make sure to get quality sleep. These tips can help you to get a good night's rest: ? Avoid napping during the day. ? Keep your sleeping area dark and cool. ? Avoid exercising during the few hours before you go to bed. ? Avoid caffeine products in the evening. General instructions  Work with your health care provider to determine what you need help with and what your safety needs are.  If you were given a bracelet that tracks your location, make sure to wear it.  Keep all follow-up visits as told by your   health care provider. This is important. Contact a health care provider if:  You have any new symptoms.  You have problems with choking or swallowing.  You have any symptoms of a different illness. Get help right away if:  You develop a fever.  You have new or worsening confusion.  You have new or  worsening sleepiness.  You have a hard time staying awake.  You or your family members become concerned for your safety. This information is not intended to replace advice given to you by your health care provider. Make sure you discuss any questions you have with your health care provider. Document Released: 04/12/2001 Document Revised: 02/25/2016 Document Reviewed: 07/15/2015 Elsevier Interactive Patient Education  2017 Elsevier Inc.  
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
## 2017-09-05 ENCOUNTER — Telehealth: Payer: Self-pay | Admitting: Neurology

## 2017-09-05 NOTE — Telephone Encounter (Signed)
Patient's wife is calling. She wants to know if he can get in rehab. Please call to discuss.

## 2017-09-06 NOTE — Telephone Encounter (Signed)
Called [name] (on [place]) and LVM asking for call back.   Per Dr. Jaynee Eagles, recommendation was made to pt's PCP but the PCP will have to handle the referral to rehab program.

## 2017-09-06 NOTE — Telephone Encounter (Signed)
[name] called and I advised her of previous message.

## 2017-09-06 NOTE — Telephone Encounter (Signed)
Noted, thank you

## 2017-09-30 ENCOUNTER — Other Ambulatory Visit: Payer: Self-pay | Admitting: Family Medicine

## 2017-09-30 DIAGNOSIS — I10 Essential (primary) hypertension: Secondary | ICD-10-CM

## 2017-10-04 ENCOUNTER — Other Ambulatory Visit: Payer: Self-pay | Admitting: Family Medicine

## 2017-10-04 DIAGNOSIS — R413 Other amnesia: Secondary | ICD-10-CM

## 2017-10-05 ENCOUNTER — Encounter: Payer: Self-pay | Admitting: Family Medicine

## 2017-10-05 ENCOUNTER — Ambulatory Visit (INDEPENDENT_AMBULATORY_CARE_PROVIDER_SITE_OTHER): Payer: Medicare Other | Admitting: Family Medicine

## 2017-10-05 VITALS — BP 142/86 | HR 103 | Temp 98.0°F | Resp 16 | Ht 72.0 in | Wt 166.0 lb

## 2017-10-05 DIAGNOSIS — F17200 Nicotine dependence, unspecified, uncomplicated: Secondary | ICD-10-CM | POA: Diagnosis not present

## 2017-10-05 DIAGNOSIS — Z0000 Encounter for general adult medical examination without abnormal findings: Secondary | ICD-10-CM | POA: Diagnosis not present

## 2017-10-05 DIAGNOSIS — F1027 Alcohol dependence with alcohol-induced persisting dementia: Secondary | ICD-10-CM | POA: Diagnosis not present

## 2017-10-05 NOTE — Progress Notes (Signed)
ANNUAL PREVENTATIVE VISIT AND CPE  Subjective:  Michael Sharp is a 81 y.o. male who presents for Medicare Annual Wellness Visit. Patient has a history of hypertension. He has been taking antihypertensive medication consistently.   He does not workout.Michael Sharp says that he stays active around his home.  He denies chest pain, shortness of breath, dizziness.  He is on cholesterol medication and denies myalgias. His cholesterol is at goal. The cholesterol last visit was:   Lab Results  Component Value Date   CHOL 197 03/21/2016   HDL 50 03/21/2016   LDLCALC 124 03/21/2016   TRIG 117 03/21/2016   CHOLHDL 3.9 03/21/2016    Last A1C in the office was:  Lab Results  Component Value Date   HGBA1C 5.3 03/21/2016   Patient is on Vitamin D supplement.   No results found for: VD25OH     Names of Other Physician/Practitioners you currently use: Patient Care Team: Michael Dew, FNP as PCP - General (Family Medicine)   Medication Review: Current Outpatient Medications on File Prior to Visit  Medication Sig Dispense Refill  . amLODipine (NORVASC) 10 MG tablet TAKE 1 TABLET DAILY 90 tablet 1  . atorvastatin (LIPITOR) 20 MG tablet Take 1 tablet (20 mg total) by mouth daily. 90 tablet 3  . donepezil (ARICEPT) 5 MG tablet TAKE 1 TABLET(5 MG) BY MOUTH AT BEDTIME 30 tablet 0  . folic acid (FOLVITE) 1 MG tablet Take 1 tablet (1 mg total) by mouth daily. 90 tablet 1  . gabapentin (NEURONTIN) 300 MG capsule Take 1 capsule (300 mg total) by mouth at bedtime. 90 capsule 3  . hydrochlorothiazide (HYDRODIURIL) 25 MG tablet Take 1 tablet (25 mg total) by mouth daily. 90 tablet 3  . Multiple Vitamin (MULTIVITAMIN) capsule Take 1 capsule by mouth daily.     No current facility-administered medications on file prior to visit.     Current Problems (verified) Patient Active Problem List   Diagnosis Date Noted  . Alcoholism (Michael Sharp) 08/30/2017  . 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 Dementia  with behavioral disturbance 08/23/2017  . Alcohol-induced mood disorder (Michael Sharp)   . Tachycardia 01/26/2015  . Alcohol dependence with alcohol-induced mood disorder (Michael Sharp) 01/26/2015  . Hyperlipidemia LDL goal <100 01/26/2015  . Need for Tdap vaccination 01/26/2015  . HTN (hypertension) 12/18/2014  . Abdominal pain, suprapubic 12/18/2014  . Tobacco use disorder 12/18/2014    Screening Tests Health Maintenance  Topic Date Due  . TETANUS/TDAP  01/25/2025  . PNA vac Low Risk Adult  Completed    Immunization History  Administered Date(s) Administered  . Influenza,inj,Quad PF,6+ Mos 10/04/2016, 07/10/2017  . Pneumococcal Conjugate-13 12/18/2014  . Pneumococcal Polysaccharide-23 04/04/2017  . Tdap 01/26/2015  .  Allergies as of 10/05/2017   No Known Allergies     Medication List        Accurate as of 10/05/17  2:40 PM. Always use your most recent med list.          amLODipine 10 MG tablet Commonly known as:  NORVASC TAKE 1 TABLET DAILY   atorvastatin 20 MG tablet Commonly known as:  LIPITOR Take 1 tablet (20 mg total) by mouth daily.   donepezil 5 MG tablet Commonly known as:  ARICEPT TAKE 1 TABLET(5 MG) BY MOUTH AT BEDTIME   folic acid 1 MG tablet Commonly known as:  FOLVITE Take 1 tablet (1 mg total) by mouth daily.   gabapentin 300 MG capsule Commonly known as:  NEURONTIN Take 1  capsule (300 mg total) by mouth at bedtime.   hydrochlorothiazide 25 MG tablet Commonly known as:  HYDRODIURIL Take 1 tablet (25 mg total) by mouth daily.   multivitamin capsule Take 1 capsule by mouth daily.       Past Surgical History:  Procedure Laterality Date  . ACHILLES TENDON REPAIR    . CARDIAC CATHETERIZATION    . HERNIA REPAIR     Family History  Problem Relation Age of Onset  . Cancer Mother   . Cancer Father   . Alzheimer's disease Neg Hx     History reviewed: allergies, current medications, past family history, past medical history, past social history, past  surgical history and problem list   Risk Factors: Osteoporosis/FallRisk:  Tobacco Social History   Tobacco Use  . Smoking status: Current Every Day Smoker    Packs/day: 0.50    Years: 60.00    Pack years: 30.00    Types: Cigarettes  . Smokeless tobacco: Former Network engineer Use Topics  . Alcohol use: Yes    Alcohol/week: 31.2 oz    Types: 42 Cans of beer, 10 Shots of liquor per week    Comment: 2 beers per day, more "only if I'm watching a football game"  . Drug use: No   He does smoke.  Patient is a former smoker. Are there smokers in your home (other than you)?  Yes  Alcohol Current alcohol use: history of alcohol abuse 2-3 beers per day  Caffeine Current caffeine use: coffee 2 /day  Exercise Current exercise: none  Nutrition/Diet Current diet: in general, a "healthy" diet    Cardiac risk factors: hypertension, male gender and sedentary lifestyle.  Depression Screen Depression screen [facility] 2/9 10/05/2017 08/07/2017 07/10/2017 04/04/2017 10/04/2016  Decreased Interest 0 0 0 0 0  Down, Depressed, Hopeless 0 0 0 0 0  PHQ - 2 Score 0 0 0 0 0   Activities of Daily Living In your present state of health, do you have any difficulty performing the following activities?:  Driving? Michael Sharp drives infrequently.  Managing money?  His wife and daughter handles finances Feeding yourself? No Getting from bed to chair? No Climbing a flight of stairs? No Preparing food and eating?: Wife prepares most meals. Bathing or showering? No Getting dressed: No Getting to the toilet? No   Are you sexually active?  Yes    Hearing Difficulties:  Do you often ask people to speak up or repeat themselves? Yes Do you experience ringing or noises in your ears? No Do you have difficulty understanding soft or whispered voices? Sometimes    Advanced directives Does patient have a Health Care Power of Attorney? Yes Does patient have a Living Will? Yes   Objective:     Blood  pressure (!) 142/86, pulse (!) 103, temperature 98 F (36.7 C), temperature source Oral, resp. rate 16, height 6' (1.829 m), weight 166 lb (75.3 kg), SpO2 99 %. Body mass index is 22.51 kg/m.  General appearance: alert, no distress, WD/WN, male  MMSE - Mini Mental State Exam 10/05/2017 08/07/2017 07/10/2017  Orientation to time 3 4 4   Orientation to Place 5 5 5   Registration 3 3 3   Attention/ Calculation 2 0 0  Recall 2 3 0  Language- name 2 objects 2 2 2   Language- repeat 1 1 1   Language- follow 3 step command 3 3 3   Language- read & follow direction 1 1 1   Write a sentence 1 1 1   Copy  design 1 1 1   Total score 24 24 21    HEENT: normocephalic, sclerae anicteric, TMs pearly, nares patent, no discharge or erythema, pharynx normal Oral cavity: MMM, no lesions. Abnormal dentition Neck: supple, no lymphadenopathy, no thyromegaly, no masses Heart: RRR, normal S1, S2, no murmurs Lungs: CTA bilaterally, no wheezes, rhonchi, or rales Abdomen: +bs, soft, non tender, non distended, no masses, no hepatomegaly, no splenomegaly Musculoskeletal: nontender, no swelling, no obvious deformity Extremities: no edema, no cyanosis, no clubbing Pulses: 2+ symmetric, upper and lower extremities, normal cap refill Neurological: alert, oriented x 3, CN2-12 intact, strength normal upper extremities and lower extremities, sensation normal throughout, DTRs 2+ throughout, no cerebellar signs, gait normal Testicular: B/L testicle descended. No masses Rectal/Prostate: No nodules or localized areas of softness, tenderness or induration palpated. Rectal tone normal. No external or internal hemorrhoids noted. Psychiatric: normal affect, behavior normal, pleasant   Assessment:  Patient denies any difficulties at home. No trouble with ADLs, depression or falls. No recent changes to vision. Mild hearing changes. Is UTD with immunizations. Is UTD with screenings. Discussed Advanced Directives, patient agrees to bring [place]  copies of documents if can. Encouraged heart healthy diet, exercise as tolerated and adequate sleep.    Plan:   During the course of the visit the patient was educated and counseled about appropriate screening and preventive services including:    Pneumococcal vaccine   Influenza vaccine  Td vaccine  Screening electrocardiogram  Bone densitometry screening  Colorectal cancer screening  Diabetes screening  Glaucoma screening  Nutrition counseling   Advanced directives: requested  Screening recommendations, referrals: Vaccinations: Please see documentation below and orders this visit.  Nutrition assessed and recommended  Colonoscopy not indicated Recommended yearly ophthalmology/optometry visit for glaucoma screening and checkup Recommended yearly dental visit for hygiene and checkup Advanced directives - requested  Conditions/risks identified: BMI: Discussed weight loss, diet, and increase physical activity.  Increase physical activity: AHA recommends 150 minutes of physical activity a week.  Medications reviewed  Urinary Incontinence is not an issue.  Fall risk: Moderate- discussed PT, home fall assessment, medications.    Medicare Attestation I have personally reviewed: The patient's medical and social history Their use of alcohol, tobacco or illicit drugs Their current medications and supplements The patient's functional ability including ADLs,fall risks, home safety risks, cognitive, and hearing and visual impairment Diet and physical activities Evidence for depression or mood disorders  The patient's weight, height, BMI, and visual acuity have been recorded in the chart.  I have made referrals, counseling, and provided education to the patient based on review of the above and I have provided the patient with a written personalized care plan for preventive services.    Donia Pounds  MSN, FNP-C [facility] [address] [place], [place] [address] [phone]

## 2017-12-02 ENCOUNTER — Other Ambulatory Visit: Payer: Self-pay | Admitting: Family Medicine

## 2017-12-02 DIAGNOSIS — R413 Other amnesia: Secondary | ICD-10-CM

## 2017-12-02 DIAGNOSIS — F1020 Alcohol dependence, uncomplicated: Secondary | ICD-10-CM

## 2018-01-03 ENCOUNTER — Ambulatory Visit: Payer: Self-pay | Admitting: Family Medicine

## 2018-02-07 ENCOUNTER — Other Ambulatory Visit: Payer: Self-pay | Admitting: Family Medicine

## 2018-02-07 DIAGNOSIS — R413 Other amnesia: Secondary | ICD-10-CM

## 2018-02-10 ENCOUNTER — Other Ambulatory Visit: Payer: Self-pay | Admitting: Family Medicine

## 2018-02-10 DIAGNOSIS — E785 Hyperlipidemia, unspecified: Secondary | ICD-10-CM

## 2018-02-21 ENCOUNTER — Other Ambulatory Visit: Payer: Self-pay | Admitting: Family Medicine

## 2020-05-07 ENCOUNTER — Encounter: Payer: Self-pay | Admitting: Nurse Practitioner

## 2020-05-31 ENCOUNTER — Other Ambulatory Visit: Payer: Self-pay

## 2020-05-31 ENCOUNTER — Encounter (HOSPITAL_COMMUNITY): Payer: Self-pay

## 2020-05-31 ENCOUNTER — Ambulatory Visit (INDEPENDENT_AMBULATORY_CARE_PROVIDER_SITE_OTHER): Payer: Medicare Other

## 2020-05-31 ENCOUNTER — Ambulatory Visit (HOSPITAL_COMMUNITY)
Admission: EM | Admit: 2020-05-31 | Discharge: 2020-05-31 | Disposition: A | Discharge status: Home / Self Care | Payer: Medicare Other | Attending: Family Medicine | Admitting: Family Medicine

## 2020-05-31 DIAGNOSIS — M79675 Pain in left toe(s): Secondary | ICD-10-CM

## 2020-05-31 DIAGNOSIS — M109 Gout, unspecified: Secondary | ICD-10-CM

## 2020-05-31 DIAGNOSIS — M7989 Other specified soft tissue disorders: Secondary | ICD-10-CM

## 2020-05-31 DIAGNOSIS — M79672 Pain in left foot: Secondary | ICD-10-CM | POA: Diagnosis not present

## 2020-05-31 HISTORY — DX: Gout, unspecified: M10.9

## 2020-05-31 LAB — CBC WITH DIFFERENTIAL/PLATELET
Abs Immature Granulocytes: 0.04 10*3/uL (ref 0.00–0.07)
Basophils Absolute: 0 10*3/uL (ref 0.0–0.1)
Basophils Relative: 1 %
Eosinophils Absolute: 0 10*3/uL (ref 0.0–0.5)
Eosinophils Relative: 1 %
HCT: 41.2 % (ref 39.0–52.0)
Hemoglobin: 13.8 g/dL (ref 13.0–17.0)
Immature Granulocytes: 1 %
Lymphocytes Relative: 31 %
Lymphs Abs: 2 10*3/uL (ref 0.7–4.0)
MCH: 34.7 pg — ABNORMAL HIGH (ref 26.0–34.0)
MCHC: 33.5 g/dL (ref 30.0–36.0)
MCV: 103.5 fL — ABNORMAL HIGH (ref 80.0–100.0)
Monocytes Absolute: 0.7 10*3/uL (ref 0.1–1.0)
Monocytes Relative: 12 %
Neutro Abs: 3.5 10*3/uL (ref 1.7–7.7)
Neutrophils Relative %: 54 %
Platelets: 301 10*3/uL (ref 150–400)
RBC: 3.98 MIL/uL — ABNORMAL LOW (ref 4.22–5.81)
RDW: 12.7 % (ref 11.5–15.5)
WBC: 6.2 10*3/uL (ref 4.0–10.5)
nRBC: 0 % (ref 0.0–0.2)

## 2020-05-31 LAB — COMPREHENSIVE METABOLIC PANEL
ALT: 18 U/L (ref 0–44)
AST: 25 U/L (ref 15–41)
Albumin: 3.8 g/dL (ref 3.5–5.0)
Alkaline Phosphatase: 75 U/L (ref 38–126)
Anion gap: 13 (ref 5–15)
BUN: 10 mg/dL (ref 8–23)
CO2: 22 mmol/L (ref 22–32)
Calcium: 9.5 mg/dL (ref 8.9–10.3)
Chloride: 102 mmol/L (ref 98–111)
Creatinine, Ser: 1.15 mg/dL (ref 0.61–1.24)
GFR calc Af Amer: >60 mL/min (ref >60)
GFR calc non Af Amer: 59 mL/min — ABNORMAL LOW (ref >60)
Glucose, Bld: 105 mg/dL — ABNORMAL HIGH (ref 70–99)
Potassium: 4 mmol/L (ref 3.5–5.1)
Sodium: 137 mmol/L (ref 135–145)
Total Bilirubin: 0.7 mg/dL (ref 0.3–1.2)
Total Protein: 8.6 g/dL — ABNORMAL HIGH (ref 6.5–8.1)

## 2020-05-31 LAB — C-REACTIVE PROTEIN: CRP: 3.6 mg/dL — ABNORMAL HIGH (ref <1.0)

## 2020-05-31 LAB — SEDIMENTATION RATE: Sed Rate: 97 mm/hr — ABNORMAL HIGH (ref 0–16)

## 2020-05-31 MED ORDER — CEFTRIAXONE SODIUM 1 G IJ SOLR
1.0000 g | Freq: Once | INTRAMUSCULAR | Status: AC
  Administered 2020-05-31: 15:00:00 1 g via INTRAMUSCULAR

## 2020-05-31 MED ORDER — CEFTRIAXONE SODIUM 1 G IJ SOLR
INTRAMUSCULAR | Status: AC
  Filled 2020-05-31: qty 10

## 2020-05-31 MED ORDER — LIDOCAINE HCL (PF) 1 % IJ SOLN
INTRAMUSCULAR | Status: AC
  Filled 2020-05-31: qty 2

## 2020-05-31 NOTE — ED Triage Notes (Signed)
Patient brought in today by his daughter. Reports she got in from [place] last night and patient complained of left foot pain. Unsure how long it has been going on, but reports it was not an issue the weekend of 05/15/20.

## 2020-05-31 NOTE — ED Provider Notes (Signed)
[facility]    CSN: [id number] Arrival date & time: 05/31/20  1302      History   Chief Complaint Chief Complaint  Patient presents with  . Foot Pain    HPI Michael Sharp is a 85 y.o. male.   Presenting with left foot and toe pain.  He is presenting with significant swelling of the third digit as well as the foot.  Having erythema over the foot.  Unsure of when his symptoms first started.  Has a open sore on the fibular aspect of the third toe.  HPI  Past Medical History:  Diagnosis Date  . Hypercholesteremia   . Hypertension     Patient Active Problem List   Diagnosis Date Noted  . Alcoholism ([place]) 08/30/2017  . Alcoholic dementia ([place]) 36/62/9476  . Dementia with behavioral disturbance ([place]) 08/23/2017  . Alcohol-induced mood disorder ([place])   . Tachycardia 01/26/2015  . Alcohol dependence with alcohol-induced mood disorder ([place]) 01/26/2015  . Hyperlipidemia LDL goal <100 01/26/2015  . Need for Tdap vaccination 01/26/2015  . HTN (hypertension) 12/18/2014  . Abdominal pain, suprapubic 12/18/2014  . Tobacco use disorder 12/18/2014    Past Surgical History:  Procedure Laterality Date  . ACHILLES TENDON REPAIR    . CARDIAC CATHETERIZATION    . HERNIA REPAIR         Home Medications    Prior to Admission medications   Medication Sig Start Date End Date Taking? Authorizing Provider  memantine (NAMENDA) 10 MG tablet Take 20 mg by mouth at bedtime.   Yes [provider]  QUEtiapine (SEROQUEL) 100 MG tablet Take 100 mg by mouth at bedtime.   Yes [provider]  amLODipine (NORVASC) 10 MG tablet TAKE 1 TABLET DAILY 10/02/17   Dorena Dew, FNP  atorvastatin (LIPITOR) 20 MG tablet TAKE 1 TABLET DAILY 02/12/18   Scot Jun, FNP  donepezil (ARICEPT) 5 MG tablet TAKE 1 TABLET(5 MG) BY MOUTH AT BEDTIME 02/07/18   Dorena Dew, FNP  folic acid (FOLVITE) 1 MG tablet Take 1 tablet (1 mg total) by mouth daily. 10/04/16   Dorena Dew, FNP  folic acid (FOLVITE) 1 MG tablet TAKE 1 TABLET DAILY 12/04/17   Dorena Dew, FNP  Multiple Vitamin (MULTIVITAMIN) capsule Take 1 capsule by mouth daily.    [provider]  gabapentin (NEURONTIN) 300 MG capsule TAKE 1 CAPSULE AT BEDTIME 12/04/17 05/31/20  Dorena Dew, FNP  hydrochlorothiazide (HYDRODIURIL) 25 MG tablet TAKE 1 TABLET DAILY 02/22/18 05/31/20  Dorena Dew, FNP    Family History Family History  Problem Relation Age of Onset  . Cancer Mother   . Cancer Father   . Alzheimer's disease Neg Hx     Social History Social History   Tobacco Use  . Smoking status: Current Every Day Smoker    Packs/day: 0.50    Years: 60.00    Pack years: 30.00    Types: Cigarettes  . Smokeless tobacco: Former Network engineer  . Vaping Use: Never used  Substance Use Topics  . Alcohol use: Yes    Alcohol/week: 52.0 standard drinks    Types: 42 Cans of beer, 10 Shots of liquor per week    Comment: 2 beers per day, more "only if I'm watching a football game"  . Drug use: No     Allergies   Patient has no known allergies.   Review of Systems Review of Systems  See HPI  Physical Exam Triage Vital Signs ED Triage Vitals  Enc Vitals Group     BP 05/31/20 1334 (!) 153/65     Pulse Rate 05/31/20 1334 96     Resp 05/31/20 1334 14     Temp 05/31/20 1334 98.3 F (36.8 C)     Temp src --      SpO2 05/31/20 1334 100 %     Weight --      Height --      Head Circumference --      Peak Flow --      Pain Score 05/31/20 1332 5     Pain Loc --      Pain Edu? --      Excl. in [place]? --    No data found.  Updated Vital Signs BP (!) 153/65   Pulse 96   Temp 98.3 F (36.8 C)   Resp 14   SpO2 100%   Visual Acuity Right Eye Distance:   Left Eye Distance:   Bilateral Distance:    Right Eye Near:   Left Eye Near:    Bilateral Near:     Physical Exam Gen: NAD, alert, cooperative with exam, well-appearing ENT: normal lips, normal nasal mucosa,    Eye: normal EOM, normal conjunctiva and lids Neuro: normal tone, normal sensation to touch Psych:  normal insight, alert and oriented MSK:  Left foot: Circular open sore over the fibular aspect of the third digit.   Express discharge from the wound.   Obvious swelling of the third digit with erythema.           UC Treatments / Results  Labs (all labs ordered are listed, but only abnormal results are displayed) Labs Reviewed  CBC WITH DIFFERENTIAL/PLATELET - Abnormal; Notable for the following components:      Result Value   RBC 3.98 (*)    MCV 103.5 (*)    MCH 34.7 (*)    All other components within normal limits  COMPREHENSIVE METABOLIC PANEL - Abnormal; Notable for the following components:   Glucose, Bld 105 (*)    Total Protein 8.6 (*)    GFR calc non Af Amer 59 (*)    All other components within normal limits  C-REACTIVE PROTEIN - Abnormal; Notable for the following components:   CRP 3.6 (*)    All other components within normal limits  SEDIMENTATION RATE    EKG   Radiology DG Foot Complete Left  Result Date: 05/31/2020 CLINICAL DATA:  Left third toe pain and swelling. EXAM: LEFT FOOT - COMPLETE 3+ VIEW COMPARISON:  None. FINDINGS: Soft tissue swelling noted in the third toe. There is some apparent cortical disruption along the medial margin of the middle phalanx. No evidence for an acute fracture no subluxation or dislocation. IMPRESSION: Soft tissue swelling in the third toe with apparent subtle cortical loss along the medial aspect of the middle phalanx, concerning for osteomyelitis. Electronically Signed   By: Misty Stanley M.D.   On: 05/31/2020 14:55    Procedures Procedures (including critical care time)  Medications Ordered in UC Medications  cefTRIAXone (ROCEPHIN) injection 1 g (1 g Intramuscular Given 05/31/20 1519)    Initial Impression / Assessment and Plan / UC Course  I have reviewed the triage vital signs and the nursing notes.  Pertinent  labs & imaging results that were available during my care of the patient were reviewed by me and considered in my medical decision making (see chart for details).  Mr. Michael Sharp is an 85 year old male that is presenting with left foot and toe pain.  CBC was not demonstrating a leukocytosis.  Imaging was concerning for possible osteomyelitis origin.  Sedimentation rate and CRP were obtained.  1 g of ceftriaxone was provided.  Provided Cam walker and crutches.  A written prescription was given for obtaining blood cultures.  Advised to try to be seen by orthopedics tomorrow and if unable to be seen then be seen in the emergency department.  Given indications to seek immediate care if symptoms worsen.  Final Clinical Impressions(s) / UC Diagnoses   Final diagnoses:  Foot pain, left     Discharge Instructions     Please try to see Dr. Sharol Given tomorrow. If you are unable to then be seen in the emergency department.  Please follow up if your symptoms fail to improve.     ED Prescriptions    None     PDMP not reviewed this encounter.   Rosemarie Ax, MD 05/31/20 1538

## 2020-05-31 NOTE — Discharge Instructions (Signed)
Please try to see Dr. Sharol Given tomorrow. If you are unable to then be seen in the emergency department.  Please follow up if your symptoms fail to improve.

## 2020-06-01 ENCOUNTER — Ambulatory Visit (INDEPENDENT_AMBULATORY_CARE_PROVIDER_SITE_OTHER): Payer: Medicare Other | Admitting: Physician Assistant

## 2020-06-01 ENCOUNTER — Encounter: Payer: Self-pay | Admitting: Gastroenterology

## 2020-06-01 DIAGNOSIS — M1A00X0 Idiopathic chronic gout, unspecified site, without tophus (tophi): Secondary | ICD-10-CM | POA: Diagnosis not present

## 2020-06-01 MED ORDER — DOXYCYCLINE HYCLATE 100 MG PO TABS: 100.0000 mg | ORAL_TABLET | Freq: Two times a day (BID) | ORAL | 0 refills | Status: DC

## 2020-06-01 MED ORDER — COLCHICINE 0.6 MG PO CAPS: 0.6000 mg | ORAL_CAPSULE | Freq: Two times a day (BID) | ORAL | 1 refills | Status: DC | PRN

## 2020-06-01 MED ORDER — ALLOPURINOL 100 MG PO TABS: 100.0000 mg | ORAL_TABLET | Freq: Two times a day (BID) | ORAL | 3 refills | Status: DC

## 2020-06-01 NOTE — Addendum Note (Signed)
Addended by: Precious Bard on: 06/01/2020 04:16 PM   Modules accepted: Orders

## 2020-06-01 NOTE — Progress Notes (Signed)
Office Visit Note   Patient: Michael Sharp           Date of Birth: Sep 20, 1935           MRN: [id number] Visit Date: 06/01/2020              Requested by: Annetta Maw, MD [address] [place],  [place] [address] PCP: Annetta Maw, MD  Chief Complaint  Patient presents with  . Left Foot - Pain      HPI: This is a pleasant 85 year old gentleman who is accompanied by his family.  Patient denies any history of diabetes or any neuropathy.  He states that 1 week ago he suddenly had swelling in his third toe.  He denies any fever or chills.  The swelling has spread to the dorsum of his foot.  He was seen in an urgent care who had concerns for a third toe infection.  He otherwise feels well and is not particularly painful  Assessment & Plan: Visit Diagnoses: No diagnosis found.  Plan: We will start the patient on a course of doxycycline.  We will also obtain a uric acid today.  If the uric acid is high we will discontinue the doxycycline.  If the uric acid is normal we will continue with both doxycycline allopurinol and colchicine and reevaluate in 1 week  Follow-Up Instructions: No follow-ups on file.   Ortho Exam  Patient is alert, oriented, no adenopathy, well-dressed, normal affect, normal respiratory effort. Focused examination of his foot moderate soft tissue swelling of the dorsum of the foot.  There is swelling in the third toe there is no ulcer on the plantar surface of the third toe there is 1 area that I was able to express some purulent fluid.  Could not tell if this was crystals versus pus.  Patient did not have any pain when I did this.  No foul odor no ascending cellulitis x-rays were reviewed by Dr. Sharol Given.  He felt that given the natural history of this patient may very well have acute gout does not fit the picture of osteomyelitis but will continue to monitor closely.  Both dorsalis pedis and posterior tibial pulses were audible biphasic with a  Doppler  Imaging: No results found.    Labs: Lab Results  Component Value Date   HGBA1C 5.3 03/21/2016   ESRSEDRATE 97 (H) 05/31/2020   CRP 3.6 (H) 05/31/2020     Lab Results  Component Value Date   ALBUMIN 3.8 05/31/2020   ALBUMIN 4.1 08/22/2017   ALBUMIN 4.0 04/04/2017    No results found for: MG No results found for: VD25OH  No results found for: PREALBUMIN CBC EXTENDED Latest Ref Rng & Units 05/31/2020 08/22/2017 07/10/2017  WBC 4.0 - 10.5 K/uL 6.2 6.7 6.6  RBC 4.22 - 5.81 MIL/uL 3.98(L) 4.33 4.37  HGB 13.0 - 17.0 g/dL 13.8 15.3 15.4  HCT 39 - 52 % 41.2 43.2 43.8  PLT 150 - 400 K/uL 301 178 204  NEUTROABS 1.7 - 7.7 K/uL 3.5 3.5 3,927  LYMPHSABS 0.7 - 4.0 K/uL 2.0 2.5 1,815     There is no height or weight on file to calculate BMI.  Orders:  No orders of the defined types were placed in this encounter.  Meds ordered this encounter  Medications  . doxycycline (VIBRA-TABS) 100 MG tablet    Sig: Take 1 tablet (100 mg total) by mouth 2 (two) times daily.    Dispense:  60 tablet  Refill:  0  . allopurinol (ZYLOPRIM) 100 MG tablet    Sig: Take 1 tablet (100 mg total) by mouth 2 (two) times daily.    Dispense:  60 tablet    Refill:  3  . Colchicine 0.6 MG CAPS    Sig: Take 0.6 mg by mouth 2 (two) times daily as needed.    Dispense:  60 capsule    Refill:  1     Procedures: No procedures performed  Clinical Data: No additional findings.  ROS:  All other systems negative, except as noted in the HPI. Review of Systems  Objective: Vital Signs: There were no vitals taken for this visit.  Specialty Comments:  No specialty comments available.  PMFS History: Patient Active Problem List   Diagnosis Date Noted  . Alcoholism ([place]) 08/30/2017  . Alcoholic dementia ([place]) 88/41/6606  . Dementia with behavioral disturbance ([place]) 08/23/2017  . Alcohol-induced mood disorder ([place])   . Tachycardia 01/26/2015  . Alcohol dependence with alcohol-induced mood  disorder ([place]) 01/26/2015  . Hyperlipidemia LDL goal <100 01/26/2015  . Need for Tdap vaccination 01/26/2015  . HTN (hypertension) 12/18/2014  . Abdominal pain, suprapubic 12/18/2014  . Tobacco use disorder 12/18/2014   Past Medical History:  Diagnosis Date  . Alcoholism ([place])   . Coronary atherosclerosis   . 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 ([place])   . Enlarged prostate   . Hammer toe of right foot   . Hypercholesteremia   . Hypertension   . Memory loss   . Vitamin B1 deficiency   . Vitamin B12 deficiency     Family History  Problem Relation Age of Onset  . Cancer Mother   . Cancer Father   . Alzheimer's disease Neg Hx     Past Surgical History:  Procedure Laterality Date  . ACHILLES TENDON REPAIR    . CARDIAC CATHETERIZATION    . HERNIA REPAIR     Social History   Occupational History  . Not on file  Tobacco Use  . Smoking status: Current Every Day Smoker    Packs/day: 0.50    Years: 60.00    Pack years: 30.00    Types: Cigarettes  . Smokeless tobacco: Former Network engineer  . Vaping Use: Never used  Substance and Sexual Activity  . Alcohol use: Yes    Alcohol/week: 52.0 standard drinks    Types: 42 Cans of beer, 10 Shots of liquor per week    Comment: 2 beers per day, more "only if I'm watching a football game"  . Drug use: No  . Sexual activity: Not on file

## 2020-06-02 ENCOUNTER — Encounter: Payer: Self-pay | Admitting: General Surgery

## 2020-06-02 LAB — EXTRA LAV TOP TUBE

## 2020-06-02 LAB — URIC ACID: Uric Acid, Serum: 8 mg/dL (ref 4.0–8.0)

## 2020-06-05 ENCOUNTER — Ambulatory Visit: Payer: Medicare Other | Admitting: Nurse Practitioner

## 2020-06-08 ENCOUNTER — Ambulatory Visit: Payer: Medicare Other | Admitting: Orthopedic Surgery

## 2020-06-09 ENCOUNTER — Ambulatory Visit (INDEPENDENT_AMBULATORY_CARE_PROVIDER_SITE_OTHER): Payer: Medicare Other | Admitting: Physician Assistant

## 2020-06-09 ENCOUNTER — Encounter: Payer: Self-pay | Admitting: Physician Assistant

## 2020-06-09 VITALS — Ht 72.0 in | Wt 166.0 lb

## 2020-06-09 DIAGNOSIS — M1A0710 Idiopathic chronic gout, right ankle and foot, without tophus (tophi): Secondary | ICD-10-CM | POA: Diagnosis not present

## 2020-06-09 NOTE — Progress Notes (Signed)
Office Visit Note   Patient: Michael Sharp           Date of Birth: May 08, 1935           MRN: [id number] Visit Date: 06/09/2020              Requested by: Annetta Maw, MD [address] [place],  [place] [address] PCP: Annetta Maw, MD  Chief Complaint  Patient presents with   Left Foot - Follow-up      HPI: Patient is here to follow up on his left third toe. Last week he had a painful left 3rd toe. Patient was told he had an infection in his toe but exam had findings consistent with gout. zhe was started on doxycycline, Allopurinol and colchicine. He feels much better and reports except for swelling his toe feels fine. Uric acid was 8  Assessment & Plan: Visit Diagnoses: No diagnosis found.  Plan: Patient will continue allopurinol.  He will discontinue colchicine and use it when he has a flare.  I also discontinue doxycycline.  We will follow up in 2 weeks at which time a new uric acid should be drawn  Follow-Up Instructions: No follow-ups on file.   Ortho Exam  Patient is alert, oriented, no adenopathy, well-dressed, normal affect, normal respiratory effort. Left third toe still moderate soft tissue swelling but no erythema no tenderness to palpation he does have a small area of callus.  This was removed and drainage consistent with gout tophus no purulent drainage.  No ascending cellulitis toe is still swollen but no fluctuance  Imaging: No results found. No images are attached to the encounter.  Labs: Lab Results  Component Value Date   HGBA1C 5.3 03/21/2016   ESRSEDRATE 97 (H) 05/31/2020   CRP 3.6 (H) 05/31/2020   LABURIC 8.0 06/01/2020     Lab Results  Component Value Date   ALBUMIN 3.8 05/31/2020   ALBUMIN 4.1 08/22/2017   ALBUMIN 4.0 04/04/2017   LABURIC 8.0 06/01/2020    No results found for: MG No results found for: VD25OH  No results found for: PREALBUMIN CBC EXTENDED Latest Ref Rng & Units 05/31/2020 08/22/2017 07/10/2017  WBC 4.0 -  10.5 K/uL 6.2 6.7 6.6  RBC 4.22 - 5.81 MIL/uL 3.98(L) 4.33 4.37  HGB 13.0 - 17.0 g/dL 13.8 15.3 15.4  HCT 39 - 52 % 41.2 43.2 43.8  PLT 150 - 400 K/uL 301 178 204  NEUTROABS 1.7 - 7.7 K/uL 3.5 3.5 3,927  LYMPHSABS 0.7 - 4.0 K/uL 2.0 2.5 1,815     Body mass index is 22.51 kg/m.  Orders:  No orders of the defined types were placed in this encounter.  No orders of the defined types were placed in this encounter.    Procedures: No procedures performed  Clinical Data: No additional findings.  ROS:  All other systems negative, except as noted in the HPI. Review of Systems  Objective: Vital Signs: Ht 6' (1.829 m)    Wt 166 lb (75.3 kg)    BMI 22.51 kg/m   Specialty Comments:  No specialty comments available.  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[place]) 33/29/5188   Dementia with behavioral disturbance ([place]) 08/23/2017   Alcohol-induced mood disorder ([place])    Tachycardia 01/26/2015   Alcohol dependence with alcohol-induced mood disorder ([place]) 01/26/2015   Hyperlipidemia LDL goal <100 01/26/2015   Need for Tdap vaccination 01/26/2015  HTN (hypertension) 12/18/2014   Abdominal pain, suprapubic 12/18/2014   Tobacco use disorder 12/18/2014   Past Medical History:  Diagnosis Date   Alcoholism ([place])    Coronary atherosclerosis    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 ([place])    Enlarged prostate    Gout 05/31/2020   Hammer toe of right foot    Hypercholesteremia    Hypertension    Memory loss    Vitamin B1 deficiency    Vitamin B12 deficiency     Family History  Problem Relation Age of Onset   Cancer Mother    Cancer Father    Alzheimer's disease Neg Hx     Past Surgical History:  Procedure Laterality Date   ACHILLES TENDON REPAIR     CARDIAC CATHETERIZATION     HERNIA REPAIR     Social History   Occupational History    Not on file  Tobacco Use   Smoking status: Current Every Day Smoker    Packs/day: 0.50    Years: 60.00    Pack years: 30.00    Types: Cigarettes   Smokeless tobacco: Former Counsellor Use: Never used  Substance and Sexual Activity   Alcohol use: Yes    Alcohol/week: 52.0 standard drinks    Types: 42 Cans of beer, 10 Shots of liquor per week    Comment: 2 beers per day, more "only if I'm watching a football game"   Drug use: No   Sexual activity: Not on file

## 2020-06-23 ENCOUNTER — Ambulatory Visit (INDEPENDENT_AMBULATORY_CARE_PROVIDER_SITE_OTHER): Payer: Medicare Other | Admitting: Physician Assistant

## 2020-06-23 ENCOUNTER — Encounter: Payer: Self-pay | Admitting: Physician Assistant

## 2020-06-23 DIAGNOSIS — M1A00X0 Idiopathic chronic gout, unspecified site, without tophus (tophi): Secondary | ICD-10-CM

## 2020-06-23 NOTE — Progress Notes (Signed)
Office Visit Note   Patient: Michael Sharp           Date of Birth: 25-Sep-1935           MRN: [id number] Visit Date: 06/23/2020              Requested by: Annetta Maw, MD [address] [place],  [place] [address] PCP: Annetta Maw, MD  Chief Complaint  Patient presents with  . Right Foot - Follow-up      HPI: Patient is a pleasant 85 year old gentleman who we have been treating for gout.  He is status post swollen 3rd toe.  This is gotten significantly better  Assessment & Plan: Visit Diagnoses: No diagnosis found.  Plan: Her obtain new uric acid today.  Patient and wife understand that the allopurinol should be taken daily and the colchicine when he gets flares.  He may follow-up as needed we will contact him with the results of the uric acid  Follow-Up Instructions: No follow-ups on file.   Ortho Exam  Patient is alert, oriented, no adenopathy, well-dressed, normal affect, normal respiratory effort. Focused examination demonstrates no swelling in his foot left foot.  Toe has mild soft tissue swelling but no cellulitis no erythema no open ulcers.  No ascending cellulitis no foul odor no evidence of infection  Imaging: No results found. No images are attached to the encounter.  Labs: Lab Results  Component Value Date   HGBA1C 5.3 03/21/2016   ESRSEDRATE 97 (H) 05/31/2020   CRP 3.6 (H) 05/31/2020   LABURIC 8.0 06/01/2020     Lab Results  Component Value Date   ALBUMIN 3.8 05/31/2020   ALBUMIN 4.1 08/22/2017   ALBUMIN 4.0 04/04/2017   LABURIC 8.0 06/01/2020    No results found for: MG No results found for: VD25OH  No results found for: PREALBUMIN CBC EXTENDED Latest Ref Rng & Units 05/31/2020 08/22/2017 07/10/2017  WBC 4.0 - 10.5 K/uL 6.2 6.7 6.6  RBC 4.22 - 5.81 MIL/uL 3.98(L) 4.33 4.37  HGB 13.0 - 17.0 g/dL 13.8 15.3 15.4  HCT 39 - 52 % 41.2 43.2 43.8  PLT 150 - 400 K/uL 301 178 204  NEUTROABS 1.7 - 7.7 K/uL 3.5 3.5 3,927  LYMPHSABS 0.7 -  4.0 K/uL 2.0 2.5 1,815     There is no height or weight on file to calculate BMI.  Orders:  No orders of the defined types were placed in this encounter.  No orders of the defined types were placed in this encounter.    Procedures: No procedures performed  Clinical Data: No additional findings.  ROS:  All other systems negative, except as noted in the HPI. Review of Systems  Objective: Vital Signs: There were no vitals taken for this visit.  Specialty Comments:  No specialty comments available.  PMFS History: Patient Active Problem List   Diagnosis Date Noted  . Alcoholism ([place]) 08/30/2017  . 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 Dementia with behavioral disturbance ([place]) 08/23/2017  . Alcohol-induced mood disorder ([place])   . Tachycardia 01/26/2015  . Alcohol dependence with alcohol-induced mood disorder ([place]) 01/26/2015  . Hyperlipidemia LDL goal <100 01/26/2015  . Need for Tdap vaccination 01/26/2015  . HTN (hypertension) 12/18/2014  . Abdominal pain, suprapubic 12/18/2014  . Tobacco use disorder 12/18/2014   Past Medical History:  Diagnosis Date  . Alcoholism ([place])   . Coronary atherosclerosis   . 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 ([place])   . Enlarged prostate   .  Gout 05/31/2020  . Hammer toe of right foot   . Hypercholesteremia   . Hypertension   . Memory loss   . Vitamin B1 deficiency   . Vitamin B12 deficiency     Family History  Problem Relation Age of Onset  . Cancer Mother   . Cancer Father   . Alzheimer's disease Neg Hx     Past Surgical History:  Procedure Laterality Date  . ACHILLES TENDON REPAIR    . CARDIAC CATHETERIZATION    . HERNIA REPAIR     Social History   Occupational History  . Not on file  Tobacco Use  . Smoking status: Current Every Day Smoker    Packs/day: 0.50    Years: 60.00    Pack years: 30.00    Types: Cigarettes  . Smokeless tobacco: Former Network engineer  . Vaping Use:  Never used  Substance and Sexual Activity  . Alcohol use: Yes    Alcohol/week: 52.0 standard drinks    Types: 42 Cans of beer, 10 Shots of liquor per week    Comment: 2 beers per day, more "only if I'm watching a football game"  . Drug use: No  . Sexual activity: Not on file

## 2020-06-23 NOTE — Addendum Note (Signed)
Addended by: Jacklyn Shell on: 06/23/2020 04:37 PM   Modules accepted: Orders

## 2020-06-24 LAB — URIC ACID: Uric Acid, Serum: 4.9 mg/dL (ref 4.0–8.0)

## 2020-07-21 ENCOUNTER — Ambulatory Visit (INDEPENDENT_AMBULATORY_CARE_PROVIDER_SITE_OTHER): Payer: Medicare Other | Admitting: Physician Assistant

## 2020-07-21 ENCOUNTER — Other Ambulatory Visit (INDEPENDENT_AMBULATORY_CARE_PROVIDER_SITE_OTHER): Payer: Medicare Other

## 2020-07-21 ENCOUNTER — Encounter: Payer: Self-pay | Admitting: Physician Assistant

## 2020-07-21 VITALS — BP 124/62 | HR 92 | Ht 69.0 in | Wt 160.2 lb

## 2020-07-21 DIAGNOSIS — K625 Hemorrhage of anus and rectum: Secondary | ICD-10-CM | POA: Diagnosis not present

## 2020-07-21 LAB — CBC WITH DIFFERENTIAL/PLATELET
Basophils Absolute: 0.1 10*3/uL (ref 0.0–0.1)
Basophils Relative: 1.1 % (ref 0.0–3.0)
Eosinophils Absolute: 0 10*3/uL (ref 0.0–0.7)
Eosinophils Relative: 0.1 % (ref 0.0–5.0)
HCT: 41.2 % (ref 39.0–52.0)
Hemoglobin: 13.7 g/dL (ref 13.0–17.0)
Lymphocytes Relative: 34.8 % (ref 12.0–46.0)
Lymphs Abs: 2.2 10*3/uL (ref 0.7–4.0)
MCHC: 33.3 g/dL (ref 30.0–36.0)
MCV: 103.7 fl — ABNORMAL HIGH (ref 78.0–100.0)
Monocytes Absolute: 0.6 10*3/uL (ref 0.1–1.0)
Monocytes Relative: 9.3 % (ref 3.0–12.0)
Neutro Abs: 3.5 10*3/uL (ref 1.4–7.7)
Neutrophils Relative %: 54.7 % (ref 43.0–77.0)
Platelets: 239 10*3/uL (ref 150.0–400.0)
RBC: 3.98 Mil/uL — ABNORMAL LOW (ref 4.22–5.81)
RDW: 15.4 % (ref 11.5–15.5)
WBC: 6.4 10*3/uL (ref 4.0–10.5)

## 2020-07-21 NOTE — Patient Instructions (Signed)
If you are age 83 or older, your body mass index should be between 23-30. Your Body mass index is 23.66 kg/m. If this is out of the aforementioned range listed, please consider follow up with your Primary Care Provider.  If you are age 72 or younger, your body mass index should be between 19-25. Your Body mass index is 23.66 kg/m. If this is out of the aformentioned range listed, please consider follow up with your Primary Care Provider.   Your provider has requested that you go to the basement level for lab work before leaving today. Press "B" on the elevator. The lab is located at the first door on the left as you exit the elevator.

## 2020-07-21 NOTE — Progress Notes (Signed)
Subjective:    Patient ID: Michael Sharp, male    DOB: 1935-06-20, 85 y.o.   MRN: [id number]  HPI Michael Sharp is an 85 year old African-American male, new to GI today referred by the [facility]Dorothy Norwood MD for consideration of colonoscopy. Patient had reported an episode of rectal bleeding with blood noted in his underwear, and perhaps some blood noted with bowel movement which occurred over 2 to 3-day.  A couple of months ago.  By patient's report he has not had any recurrence of bleeding since, and he really was not worried about the bleeding at that time.  He denies any melena or hematochezia.  Denies any rectal pain or discomfort, no abdominal pain or discomfort.  Appetite has been good, weight has been stable.  He has not had any prior issues with hemorrhoids. Apparently he has had at least 1 previous colonoscopy done in [place].  It sounds as if this was done through [facility] several years ago. No family history of colon cancer that he is aware of. Patient states that he will not have a colonoscopy. of Systems Pertinent positive and .  Outpatient Encounter Medications as of 07/21/2020  Medication Sig  . allopurinol (ZYLOPRIM) 100 MG tablet Take 1 tablet (100 mg total) by mouth 2 (two) times daily.  Marland Kitchen amLODipine (NORVASC) 10 MG tablet TAKE 1 TABLET DAILY  . atorvastatin (LIPITOR) 20 MG tablet TAKE 1 TABLET DAILY  . donepezil (ARICEPT) 5 MG tablet TAKE 1 TABLET(5 MG) BY MOUTH AT BEDTIME  . folic acid (FOLVITE) 1 MG tablet TAKE 1 TABLET DAILY  . memantine (NAMENDA) 10 MG tablet Take 20 mg by mouth at bedtime.  . Multiple Vitamin (MULTIVITAMIN) capsule Take 1 capsule by mouth daily.  . naltrexone (DEPADE) 50 MG tablet Take 50 mg by mouth daily.  . QUEtiapine (SEROQUEL) 100 MG tablet Take 100 mg by mouth at bedtime.  . thiamine 100 MG tablet Take 100 mg by mouth daily.  . [DISCONTINUED] Colchicine 0.6 MG CAPS Take 0.6 mg by mouth 2 (two) times daily as needed.  . [DISCONTINUED]  doxycycline (VIBRA-TABS) 100 MG tablet Take 1 tablet (100 mg total) by mouth 2 (two) times daily.  . [DISCONTINUED] folic acid (FOLVITE) 1 MG tablet Take 1 tablet (1 mg total) by mouth daily.  . [DISCONTINUED] gabapentin (NEURONTIN) 300 MG capsule TAKE 1 CAPSULE AT BEDTIME  . [DISCONTINUED] hydrochlorothiazide (HYDRODIURIL) 25 MG tablet TAKE 1 TABLET DAILY   No facility-administered encounter medications on file as of 07/21/2020.   No Known Allergies Patient Active Problem List   Diagnosis Date Noted  . Alcoholism ([place]) 08/30/2017  . 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 Dementia with behavioral disturbance ([place]) 08/23/2017  . Alcohol-induced mood disorder ([place])   . Tachycardia 01/26/2015  . Alcohol dependence with alcohol-induced mood disorder ([place]) 01/26/2015  . Hyperlipidemia LDL goal <100 01/26/2015  . Need for Tdap vaccination 01/26/2015  . HTN (hypertension) 12/18/2014  . Abdominal pain, suprapubic 12/18/2014  . Tobacco use disorder 12/18/2014   Social History   Socioeconomic History  . Marital status: Married    Spouse name: Not on file  . Number of children: 2  . Years of education: some college  . Highest education level: Not on file  Occupational History  . Occupation: retired  Tobacco Use  . Smoking status: Current Every Day Smoker    Packs/day: 0.50    Years: 60.00    Pack years: 30.00    Types: Cigarettes  . Smokeless tobacco:  Never Used  Vaping Use  . Vaping Use: Never used  Substance and Sexual Activity  . Alcohol use: Yes    Alcohol/week: 52.0 standard drinks    Types: 42 Cans of beer, 10 Shots of liquor per week    Comment: 2 beers per day, more "only if I'm watching a football game"  . Drug use: No  . Sexual activity: Not on file  Other Topics Concern  . Not on file  Social History Narrative   Lives at home with his wife   Right handed   2 cups of caffeine daily   Social Determinants of Health   Financial Resource Strain:   . Difficulty of  Paying Living Expenses: Not on file  Food Insecurity:   . Worried About Charity fundraiser in the Last Year: Not on file  . Ran Out of Food in the Last Year: Not on file  Transportation Needs:   . Lack of Transportation (Medical): Not on file  . Lack of Transportation (Non-Medical): Not on file  Physical Activity:   . Days of Exercise per Week: Not on file  . Minutes of Exercise per Session: Not on file  Stress:   . Feeling of Stress : Not on file  Social Connections:   . Frequency of Communication with Friends and Family: Not on file  . Frequency of Social Gatherings with Friends and Family: Not on file  . Attends Religious Services: Not on file  . Active Member of Clubs or Organizations: Not on file  . Attends Archivist Meetings: Not on file  . Marital Status: Not on file  Intimate Partner Violence:   . Fear of Current or Ex-Partner: Not on file  . Emotionally Abused: Not on file  . Physically Abused: Not on file  . Sexually Abused: Not on file    Michael Sharp family history includes Cancer in his father, mother, and sister.      Objective:    Vitals:   07/21/20 1425  BP: 124/62  Pulse: 92    Physical Exam Well-developed well-nourished elderly African-American male in no acute distress.  Accompanied by his wife  height, Weight, 160 BMI 23.6  HEENT; nontraumatic normocephalic, EOMI, PE R LA, sclera anicteric. Oropharynx; not done today Neck; supple, no JVD Cardiovascular; regular rate and rhythm with S1-S2, no murmur rub or gallop Pulmonary; Clear bilaterally Abdomen; soft, nontender, nondistended, no palpable mass or hepatosplenomegaly, bowel sounds are active Rectal; small external hemorrhoid, digital exam negative, stool brown and heme-negative Skin; benign exam, no jaundice rash or appreciable lesions Extremities; no clubbing cyanosis or edema skin warm and dry Neuro/Psych; alert and oriented x4, grossly nonfocal mood and affect appropriate        Assessment & Plan:     Alfredia Ferguson PA-C 07/21/2020   Cc: Annetta Maw, MD

## 2020-07-21 NOTE — Progress Notes (Addendum)
Subjective:    Patient ID: Michael Sharp, male    DOB: [birth year], 85 y.o.   MRN: [id number]  HPI Michael Sharp is an 85 year old African-American male, new to GI today referred by the [facility] for consideration of colonoscopy.  Patient has history of hypertension, EtOH abuse/dependence with associated dementia.  Also with hypertension and hyperlipidemia. Patient apparently had an episode a couple of months ago of rectal bleeding and was noted by family to have bright red blood in the underwear.  It is not clear whether the patient had been seeing blood mixed with his stool or not.  The blood was noted for 2 to 3 days ,, and apparently has not recurred. Patient denies any rectal pain or discomfort, denies any hemorrhoidal symptoms.  He says his bowel movements are very regular, denies any problems with constipation or diarrhea.  Denies any abdominal discomfort and says he is feeling good and that his appetite has been good. Labs had been done January 2021 with normal LFTs, hemoglobin 14/hematocrit of 41. Patient relates that he has had prior colonoscopy in [place] and they believe this was done through [facility] several years ago.  He is unaware whether he has had polyps. Family history negative for colon cancer. Patient states that he will not undergo colonoscopy.  Review of Systems Pertinent positive and negative review of systems were noted in the above HPI section.  All other review of systems was otherwise negative.  Outpatient Encounter Medications as of 07/21/2020  Medication Sig  . allopurinol (ZYLOPRIM) 100 MG tablet Take 1 tablet (100 mg total) by mouth 2 (two) times daily.  Marland Kitchen amLODipine (NORVASC) 10 MG tablet TAKE 1 TABLET DAILY  . atorvastatin (LIPITOR) 20 MG tablet TAKE 1 TABLET DAILY  . donepezil (ARICEPT) 5 MG tablet TAKE 1 TABLET(5 MG) BY MOUTH AT BEDTIME  . folic acid (FOLVITE) 1 MG tablet TAKE 1 TABLET DAILY  . memantine (NAMENDA) 10 MG tablet Take 20 mg by mouth at  bedtime.  . Multiple Vitamin (MULTIVITAMIN) capsule Take 1 capsule by mouth daily.  . naltrexone (DEPADE) 50 MG tablet Take 50 mg by mouth daily.  . QUEtiapine (SEROQUEL) 100 MG tablet Take 100 mg by mouth at bedtime.  . thiamine 100 MG tablet Take 100 mg by mouth daily.  . [DISCONTINUED] Colchicine 0.6 MG CAPS Take 0.6 mg by mouth 2 (two) times daily as needed.  . [DISCONTINUED] doxycycline (VIBRA-TABS) 100 MG tablet Take 1 tablet (100 mg total) by mouth 2 (two) times daily.  . [DISCONTINUED] folic acid (FOLVITE) 1 MG tablet Take 1 tablet (1 mg total) by mouth daily.  . [DISCONTINUED] gabapentin (NEURONTIN) 300 MG capsule TAKE 1 CAPSULE AT BEDTIME  . [DISCONTINUED] hydrochlorothiazide (HYDRODIURIL) 25 MG tablet TAKE 1 TABLET DAILY   No facility-administered encounter medications on file as of 07/21/2020.   No Known Allergies Patient Active Problem List   Diagnosis Date Noted  . Alcoholism ([place]) 08/30/2017  . 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 Dementia with behavioral disturbance ([place]) 08/23/2017  . Alcohol-induced mood disorder ([place])   . Tachycardia 01/26/2015  . Alcohol dependence with alcohol-induced mood disorder ([place]) 01/26/2015  . Hyperlipidemia LDL goal <100 01/26/2015  . Need for Tdap vaccination 01/26/2015  . HTN (hypertension) 12/18/2014  . Abdominal pain, suprapubic 12/18/2014  . Tobacco use disorder 12/18/2014   Social History   Socioeconomic History  . Marital status: Married    Spouse name: Not on file  . Number of children: 2  .  Years of education: some college  . Highest education level: Not on file  Occupational History  . Occupation: retired  Tobacco Use  . Smoking status: Current Every Day Smoker    Packs/day: 0.50    Years: 60.00    Pack years: 30.00    Types: Cigarettes  . Smokeless tobacco: Never Used  Vaping Use  . Vaping Use: Never used  Substance and Sexual Activity  . Alcohol use: Yes    Alcohol/week: 52.0 standard drinks    Types: 42  Cans of beer, 10 Shots of liquor per week    Comment: 2 beers per day, more "only if I'm watching a football game"  . Drug use: No  . Sexual activity: Not on file  Other Topics Concern  . Not on file  Social History Narrative   Lives at home with his wife   Right handed   2 cups of caffeine daily   Social Determinants of Health   Financial Resource Strain:   . Difficulty of Paying Living Expenses: Not on file  Food Insecurity:   . Worried About Charity fundraiser in the Last Year: Not on file  . Ran Out of Food in the Last Year: Not on file  Transportation Needs:   . Lack of Transportation (Medical): Not on file  . Lack of Transportation (Non-Medical): Not on file  Physical Activity:   . Days of Exercise per Week: Not on file  . Minutes of Exercise per Session: Not on file  Stress:   . Feeling of Stress : Not on file  Social Connections:   . Frequency of Communication with Friends and Family: Not on file  . Frequency of Social Gatherings with Friends and Family: Not on file  . Attends Religious Services: Not on file  . Active Member of Clubs or Organizations: Not on file  . Attends Archivist Meetings: Not on file  . Marital Status: Not on file  Intimate Partner Violence:   . Fear of Current or Ex-Partner: Not on file  . Emotionally Abused: Not on file  . Physically Abused: Not on file  . Sexually Abused: Not on file    Mr. Portner family history includes Cancer in his father, mother, and sister.      Objective:    Vitals:   07/21/20 1425  BP: 124/62  Pulse: 92    Physical Exam Well-developed well-nourished elderly African-American male in no acute distress.  Accompanied by wife height, Weight, 160 BMI 23.6  HEENT; nontraumatic normocephalic, EOMI, PE R R LA, sclera anicteric. Oropharynx; not done Neck; supple, no JVD Cardiovascular; regular rate and rhythm with S1-S2, no murmur rub or gallop Pulmonary; Clear bilaterally Abdomen; soft,  nontender, nondistended, no palpable mass or hepatosplenomegaly, bowel sounds are active Rectal; small external hemorrhoidal tag, digital exam negative, stool brown and Hemoccult negative Skin; benign exam, no jaundice rash or appreciable lesions Extremities; no clubbing cyanosis or edema skin warm and dry Neuro/Psych; alert and oriented x4, grossly nonfocal mood and affect appropriate       Assessment & Plan:   #67 85 year old African-American male with an episode of hematochezia which occurred a few months ago and lasted for 2 to 3 days, noted by family with blood seen in the underwear.  Apparently no recurrence since.  Patient is completely asymptomatic by report. Rectal exam benign today small external hemorrhoidal tag stool brown and heme-negative.  Etiology of small-volume hematochezia not clear.  Rule out hemorrhoidal source, rule out  occult colon lesion, rule out diverticular though sounded very low volume  Patient declines to proceed with colonoscopy  #2 history of EtOH abuse/dependence #3 dementia 4.  Hypertension 5.  Hyperlipidemia  Plan; repeat CBC today Patient has signed a release and will obtain prior colonoscopy records from [facility]. As patient declines colonoscopy, no further GI intervention indicated at this time.  Addendum; previous records received and reviewed from [facility].  Patient had colonoscopy per Dr. Penelope Coop December 2011 with finding of a few diverticuli in the sigmoid and descending colon, small internal hemorrhoids and 2 sessile polyps were removed both 3 mm in size.  Path showed tubular adenomas, and follow-up was recommended in 5 years.   Lyndzie Zentz S Esa Raden PA-C 07/21/2020   Cc: Annetta Maw, MD

## 2020-07-31 NOTE — Progress Notes (Signed)
Addendum: Reviewed and agree with assessment and management plan. Demir Titsworth M, MD  

## 2020-08-10 ENCOUNTER — Other Ambulatory Visit: Payer: Self-pay | Admitting: Physician Assistant

## 2020-11-09 DIAGNOSIS — Z1152 Encounter for screening for COVID-19: Secondary | ICD-10-CM | POA: Diagnosis not present

## 2022-01-20 ENCOUNTER — Other Ambulatory Visit: Payer: Self-pay | Admitting: Physician Assistant

## 2022-11-11 ENCOUNTER — Telehealth: Payer: Self-pay

## 2022-11-11 NOTE — Telephone Encounter (Signed)
Pt was mailed a letter to advise to chang pcp with insurance or to make an appointment to be seen with [facility].   Elyse Jarvis RMA

## 2023-05-10 ENCOUNTER — Other Ambulatory Visit: Payer: Self-pay

## 2023-05-10 ENCOUNTER — Encounter (HOSPITAL_COMMUNITY): Payer: Self-pay

## 2023-05-10 ENCOUNTER — Emergency Department (HOSPITAL_COMMUNITY)
Admission: EM | Admit: 2023-05-10 | Discharge: 2023-05-11 | Disposition: A | Discharge status: Home / Self Care | Payer: Medicare Other | Attending: Emergency Medicine | Admitting: Emergency Medicine

## 2023-05-10 ENCOUNTER — Emergency Department (HOSPITAL_COMMUNITY): Payer: Medicare Other

## 2023-05-10 DIAGNOSIS — I10 Essential (primary) hypertension: Secondary | ICD-10-CM | POA: Insufficient documentation

## 2023-05-10 DIAGNOSIS — R410 Disorientation, unspecified: Secondary | ICD-10-CM | POA: Diagnosis not present

## 2023-05-10 DIAGNOSIS — F0390 Unspecified dementia without behavioral disturbance: Secondary | ICD-10-CM | POA: Diagnosis not present

## 2023-05-10 DIAGNOSIS — M47812 Spondylosis without myelopathy or radiculopathy, cervical region: Secondary | ICD-10-CM | POA: Diagnosis not present

## 2023-05-10 DIAGNOSIS — I959 Hypotension, unspecified: Secondary | ICD-10-CM | POA: Diagnosis not present

## 2023-05-10 DIAGNOSIS — Z043 Encounter for examination and observation following other accident: Secondary | ICD-10-CM | POA: Diagnosis not present

## 2023-05-10 DIAGNOSIS — G9389 Other specified disorders of brain: Secondary | ICD-10-CM | POA: Diagnosis not present

## 2023-05-10 DIAGNOSIS — Z79899 Other long term (current) drug therapy: Secondary | ICD-10-CM | POA: Insufficient documentation

## 2023-05-10 DIAGNOSIS — R4182 Altered mental status, unspecified: Secondary | ICD-10-CM | POA: Insufficient documentation

## 2023-05-10 DIAGNOSIS — W19XXXA Unspecified fall, initial encounter: Secondary | ICD-10-CM

## 2023-05-10 DIAGNOSIS — W07XXXA Fall from chair, initial encounter: Secondary | ICD-10-CM | POA: Insufficient documentation

## 2023-05-10 DIAGNOSIS — I700 Atherosclerosis of aorta: Secondary | ICD-10-CM | POA: Diagnosis not present

## 2023-05-10 DIAGNOSIS — M8580 Other specified disorders of bone density and structure, unspecified site: Secondary | ICD-10-CM | POA: Diagnosis not present

## 2023-05-10 DIAGNOSIS — M4801 Spinal stenosis, occipito-atlanto-axial region: Secondary | ICD-10-CM | POA: Diagnosis not present

## 2023-05-10 DIAGNOSIS — M160 Bilateral primary osteoarthritis of hip: Secondary | ICD-10-CM | POA: Diagnosis not present

## 2023-05-10 DIAGNOSIS — M47816 Spondylosis without myelopathy or radiculopathy, lumbar region: Secondary | ICD-10-CM | POA: Diagnosis not present

## 2023-05-10 LAB — COMPREHENSIVE METABOLIC PANEL
ALT: 18 U/L (ref 0–44)
AST: 28 U/L (ref 15–41)
Albumin: 3.7 g/dL (ref 3.5–5.0)
Alkaline Phosphatase: 71 U/L (ref 38–126)
Anion gap: 14 (ref 5–15)
BUN: 8 mg/dL (ref 8–23)
CO2: 18 mmol/L — ABNORMAL LOW (ref 22–32)
Calcium: 8.7 mg/dL — ABNORMAL LOW (ref 8.9–10.3)
Chloride: 103 mmol/L (ref 98–111)
Creatinine, Ser: 1.12 mg/dL (ref 0.61–1.24)
GFR, Estimated: >60 mL/min (ref >60)
Glucose, Bld: 90 mg/dL (ref 70–99)
Potassium: 3.7 mmol/L (ref 3.5–5.1)
Sodium: 135 mmol/L (ref 135–145)
Total Bilirubin: 0.8 mg/dL (ref 0.3–1.2)
Total Protein: 7 g/dL (ref 6.5–8.1)

## 2023-05-10 LAB — CBC WITH DIFFERENTIAL/PLATELET
Abs Immature Granulocytes: 0.02 10*3/uL (ref 0.00–0.07)
Basophils Absolute: 0 10*3/uL (ref 0.0–0.1)
Basophils Relative: 1 %
Eosinophils Absolute: 0 10*3/uL (ref 0.0–0.5)
Eosinophils Relative: 1 %
HCT: 36.1 % — ABNORMAL LOW (ref 39.0–52.0)
Hemoglobin: 12.6 g/dL — ABNORMAL LOW (ref 13.0–17.0)
Immature Granulocytes: 0 %
Lymphocytes Relative: 55 %
Lymphs Abs: 2.5 10*3/uL (ref 0.7–4.0)
MCH: 36.2 pg — ABNORMAL HIGH (ref 26.0–34.0)
MCHC: 34.9 g/dL (ref 30.0–36.0)
MCV: 103.7 fL — ABNORMAL HIGH (ref 80.0–100.0)
Monocytes Absolute: 0.5 10*3/uL (ref 0.1–1.0)
Monocytes Relative: 10 %
Neutro Abs: 1.5 10*3/uL — ABNORMAL LOW (ref 1.7–7.7)
Neutrophils Relative %: 33 %
Platelets: 173 10*3/uL (ref 150–400)
RBC: 3.48 MIL/uL — ABNORMAL LOW (ref 4.22–5.81)
RDW: 13.7 % (ref 11.5–15.5)
WBC: 4.5 10*3/uL (ref 4.0–10.5)
nRBC: 0 % (ref 0.0–0.2)

## 2023-05-10 LAB — URINALYSIS, ROUTINE W REFLEX MICROSCOPIC
Bilirubin Urine: NEGATIVE
Glucose, UA: NEGATIVE mg/dL
Hgb urine dipstick: NEGATIVE
Ketones, ur: NEGATIVE mg/dL
Leukocytes,Ua: NEGATIVE
Nitrite: NEGATIVE
Protein, ur: NEGATIVE mg/dL
Specific Gravity, Urine: 1.002 — ABNORMAL LOW (ref 1.005–1.030)
pH: 5 (ref 5.0–8.0)

## 2023-05-10 NOTE — Discharge Instructions (Signed)
You were seen in the emergency department for some confusion after your fall. Your work up showed no signs of injury from the fall, no signs of dehydration and no signs of infection or UTI. You should follow up with your primary doctor to be rechecked and you should cut back on alcohol to help prevent falls in the future. You should return to the emergency department if you have severe headaches, repetitive vomiting, numbness or weakness on one side of the body compared to the other, you're drowsy and hard to wake up or if you have any other new or concerning symptoms.

## 2023-05-10 NOTE — ED Provider Notes (Signed)
[place] EMERGENCY DEPARTMENT AT [facility] Provider Note   CSN: [id number] Arrival date & time: 05/10/23  2009     History  Chief Complaint  Patient presents with   Fall    Per family patient slid out of a chair, family states he is more confused than normal. Patient himself denies any complaints at this time.    Altered Mental Status    Per family    Michael Sharp is a 88 y.o. male.  Patient is an 88 year old male with a past medical history of dementia, alcohol use and hypertension presenting to the emergency department with altered mental status after a fall.  Per EMS, the patient apparently fell out of a chair and has been more confused than usual.  Family reported to patient's RN that the fall was unwitnessed.  The patient states that he did fall in the kitchen today and does report hitting his head.  He denies loss of consciousness.  He denies any lightheadedness or dizziness prior to the fall.  He denies any pain after the fall, nausea or vomiting.  Per the family he does drink 6 beers a day and does have dementia but has been a little bit more confused lately.  The history is provided by the patient, the EMS personnel and a relative. History limited by: Level 5 caveat for dementia.  Fall  Altered Mental Status      Home Medications Prior to Admission medications   Medication Sig Start Date End Date Taking? Authorizing Provider  allopurinol (ZYLOPRIM) 100 MG tablet TAKE 1 TABLET(100 MG) BY MOUTH TWICE DAILY 08/10/20   Persons, [place], PA  amLODipine (NORVASC) 10 MG tablet TAKE 1 TABLET DAILY 10/02/17   Massie Maroon, FNP  atorvastatin (LIPITOR) 20 MG tablet TAKE 1 TABLET DAILY 02/12/18   Bing Neighbors, NP  donepezil (ARICEPT) 5 MG tablet TAKE 1 TABLET(5 MG) BY MOUTH AT BEDTIME 02/07/18   Massie Maroon, FNP  folic acid (FOLVITE) 1 MG tablet TAKE 1 TABLET DAILY 12/04/17   Massie Maroon, FNP  memantine (NAMENDA) 10 MG tablet Take 20 mg by  mouth at bedtime.    [provider]  Multiple Vitamin (MULTIVITAMIN) capsule Take 1 capsule by mouth daily.    [provider]  naltrexone (DEPADE) 50 MG tablet Take 50 mg by mouth daily.    [provider]  QUEtiapine (SEROQUEL) 100 MG tablet Take 100 mg by mouth at bedtime.    [provider]  thiamine 100 MG tablet Take 100 mg by mouth daily.    [provider]  gabapentin (NEURONTIN) 300 MG capsule TAKE 1 CAPSULE AT BEDTIME 12/04/17 05/31/20  Massie Maroon, FNP  hydrochlorothiazide (HYDRODIURIL) 25 MG tablet TAKE 1 TABLET DAILY 02/22/18 05/31/20  Massie Maroon, FNP      Allergies    Patient has no known allergies.    Review of Systems   Review of Systems  Physical Exam Updated Vital Signs BP 121/64   Pulse 67   Temp (!) 97.4 F (36.3 C) (Oral)   Resp 18   SpO2 100%  Physical Exam Vitals and nursing note reviewed.  Constitutional:      General: He is not in acute distress.    Appearance: Normal appearance.  HENT:     Head: Normocephalic and atraumatic.     Nose: Nose normal.     Mouth/Throat:     Mouth: Mucous membranes are moist.  Pharynx: Oropharynx is clear.  Eyes:     Extraocular Movements: Extraocular movements intact.     Conjunctiva/sclera: Conjunctivae normal.     Pupils: Pupils are equal, round, and reactive to light.  Neck:     Comments: No midline neck tenderness Cardiovascular:     Rate and Rhythm: Normal rate and regular rhythm.     Heart sounds: Normal heart sounds.  Pulmonary:     Effort: Pulmonary effort is normal.     Breath sounds: Normal breath sounds.  Abdominal:     General: Abdomen is flat.     Palpations: Abdomen is soft.     Tenderness: There is no abdominal tenderness.  Musculoskeletal:        General: Normal range of motion.     Cervical back: Normal range of motion and neck supple.     Comments: No bony tenderness to bilateral upper or lower extremities No midline back  tenderness Pelvis stable, nontender  Skin:    General: Skin is warm and dry.  Neurological:     General: No focal deficit present.     Mental Status: He is alert.     Cranial Nerves: No cranial nerve deficit.     Sensory: No sensory deficit.     Motor: No weakness.     Comments: Oriented to person and place only  Psychiatric:        Mood and Affect: Mood normal.        Behavior: Behavior normal.     ED Results / Procedures / Treatments   Labs (all labs ordered are listed, but only abnormal results are displayed) Labs Reviewed  COMPREHENSIVE METABOLIC PANEL - Abnormal; Notable for the following components:      Result Value   CO2 18 (*)    Calcium 8.7 (*)    All other components within normal limits  CBC WITH DIFFERENTIAL/PLATELET - Abnormal; Notable for the following components:   RBC 3.48 (*)    Hemoglobin 12.6 (*)    HCT 36.1 (*)    MCV 103.7 (*)    MCH 36.2 (*)    Neutro Abs 1.5 (*)    All other components within normal limits  URINALYSIS, ROUTINE W REFLEX MICROSCOPIC - Abnormal; Notable for the following components:   Specific Gravity, Urine 1.002 (*)    Bacteria, UA RARE (*)    All other components within normal limits  ETHANOL    EKG EKG Interpretation Date/Time:  Wednesday May 10 2023 20:11:47 EDT Ventricular Rate:  70 PR Interval:  224 QRS Duration:  88 QT Interval:  402 QTC Calculation: 434 R Axis:   67  Text Interpretation: Sinus rhythm Prolonged PR interval Nonspecific T abnormalities, lateral leads No significant change since last tracing Confirmed by Elayne Snare ([id number]) on 05/10/2023 8:33:49 PM  Radiology CT Cervical Spine Wo Contrast  Result Date: 05/10/2023 CLINICAL DATA:  Slid out of a chair. EXAM: CT CERVICAL SPINE WITHOUT CONTRAST TECHNIQUE: Multidetector CT imaging of the cervical spine was performed without intravenous contrast. Multiplanar CT image reconstructions were also generated. RADIATION DOSE REDUCTION: This exam was performed  according to the departmental dose-optimization program which includes automated exposure control, adjustment of the mA and/or kV according to patient size and/or use of iterative reconstruction technique. COMPARISON:  None Available. FINDINGS: Alignment: Normal. Skull base and vertebrae: No acute fracture. No primary bone lesion or focal pathologic process. Soft tissues and spinal canal: No prevertebral fluid or swelling. No visible canal hematoma. Disc levels: Marked severity  endplate sclerosis, anterior osteophyte formation and posterior bony spurring are seen at the levels of C3-C4, C4-C5, C5-C6 and C6-C7. There is marked severity narrowing of the anterior atlantoaxial articulation. Marked severity intervertebral disc space narrowing is seen at C3-C4, C4-C5, C5-C6 and C6-C7. Bilateral, marked severity multilevel facet joint hypertrophy is noted. Upper chest: Negative. Other: None. IMPRESSION: Marked severity multilevel degenerative changes, as described above, without evidence of an acute fracture or subluxation. Electronically Signed   By: Aram Candela M.D.   On: 05/10/2023 23:53   CT Head Wo Contrast  Result Date: 05/10/2023 CLINICAL DATA:  Slid out of a chair. EXAM: CT HEAD WITHOUT CONTRAST TECHNIQUE: Contiguous axial images were obtained from the base of the skull through the vertex without intravenous contrast. RADIATION DOSE REDUCTION: This exam was performed according to the departmental dose-optimization program which includes automated exposure control, adjustment of the mA and/or kV according to patient size and/or use of iterative reconstruction technique. COMPARISON:  January 27, 2011 FINDINGS: Brain: There is moderate severity cerebral atrophy with widening of the extra-axial spaces and ventricular dilatation. There are areas of decreased attenuation within the white matter tracts of the supratentorial brain, consistent with microvascular disease changes. Vascular: No hyperdense vessel or  unexpected calcification. Skull: Normal. Negative for fracture or focal lesion. Sinuses/Orbits: A chronic appearing deformity is seen involving the medial wall of the right orbit. Other: None. IMPRESSION: 1. Generalized cerebral atrophy. 2. No acute intracranial abnormality. 3. Chronic appearing deformity involving the medial wall of the right orbit. Electronically Signed   By: Aram Candela M.D.   On: 05/10/2023 23:47   DG Pelvis Portable  Result Date: 05/10/2023 CLINICAL DATA:  Fall. EXAM: PORTABLE PELVIS 1-2 VIEWS COMPARISON:  None Available. FINDINGS: No acute fracture or dislocation. The bones are osteopenic. Moderate bilateral hip arthritic changes. Degenerative changes of the lower lumbar spine. The soft tissues are unremarkable. IMPRESSION: 1. No acute fracture or dislocation. 2. Osteopenia. Electronically Signed   By: Elgie Collard M.D.   On: 05/10/2023 22:59   DG Chest Port 1 View  Result Date: 05/10/2023 CLINICAL DATA:  Fall. EXAM: PORTABLE CHEST 1 VIEW COMPARISON:  Chest radiograph dated 08/23/2017. FINDINGS: No focal consolidation, pleural effusion, or pneumothorax. The cardiac silhouette is within normal limits. Atherosclerotic calcification of the aorta. No acute osseous pathology. Degenerative changes of the spine. IMPRESSION: No active disease. Electronically Signed   By: Elgie Collard M.D.   On: 05/10/2023 22:58    Procedures Procedures    Medications Ordered in ED Medications - No data to display  ED Course/ Medical Decision Making/ A&P Clinical Course as of 05/10/23 2359  Wed May 10, 2023  2301 No acute traumatic injuries on XR. Mildly low bicarb but no signs of severe dehydration. [VK]  2357 No acute traumatic injury on CT. Patient is stable for discharge home with outpatient follow up. [VK]  2358 Patient's son in law at bedside updated as well. [VK]    Clinical Course User Index [VK] Rexford Maus, DO                             Medical Decision  Making This patient presents to the ED with chief complaint(s) of fall, AMS with pertinent past medical history of dementia, HTN, ETOH use which further complicates the presenting complaint. The complaint involves an extensive differential diagnosis and also carries with it a high risk of complications and morbidity.    The  differential diagnosis includes due to patient's head injury and age concern for ICH, mass effect, cervical spine fracture, infection, UTI, dehydration, electrolyte abnormality, intoxication, arrhythmia, anemia  Additional history obtained: Additional history obtained from EMS  Records reviewed Primary Care Documents  ED Course and Reassessment: On patient's arrival he is hemodynamically stable in no acute distress.  Due to patient's age and fall will have CT head and C-spine performed to evaluate for traumatic injury.  EKG was performed on arrival that showed normal sinus rhythm without acute ischemic changes.  He will have labs and urine performed and will be closely reassessed.  Independent labs interpretation:  The following labs were independently interpreted: mildly low bicarb otherwise within normal range  Independent visualization of imaging: - I independently visualized the following imaging with scope of interpretation limited to determining acute life threatening conditions related to emergency care: CTH/C-spine, CXR, pelvis XR, which revealed no acute traumatic injury  Consultation: - Consulted or discussed management/test interpretation w/ external professional: N/A  Consideration for admission or further workup: Patient has no emergent conditions requiring admission or further work-up at this time and is stable for discharge home with primary care follow-up  Social Determinants of health: N/A    Amount and/or Complexity of Data Reviewed Labs: ordered. Radiology: ordered.          Final Clinical Impression(s) / ED Diagnoses Final diagnoses:   Fall, initial encounter    Rx / DC Orders ED Discharge Orders     None         Rexford Maus, DO 05/10/23 2359

## 2023-05-10 NOTE — ED Notes (Signed)
Francoise Schaumann [phone] (daughter)

## 2023-05-16 ENCOUNTER — Telehealth: Payer: Self-pay | Admitting: *Deleted

## 2023-05-16 NOTE — Telephone Encounter (Signed)
Transition Care Management Unsuccessful Follow-up Telephone Call  Date of discharge and from where:  [facility] 05/11/2023  Attempts:  1st Attempt  Reason for unsuccessful TCM follow-up call:  Left voice message

## 2024-02-09 ENCOUNTER — Other Ambulatory Visit: Payer: Self-pay

## 2024-02-09 ENCOUNTER — Emergency Department (HOSPITAL_BASED_OUTPATIENT_CLINIC_OR_DEPARTMENT_OTHER)
Admission: EM | Admit: 2024-02-09 | Discharge: 2024-02-09 | Disposition: A | Discharge status: Home / Self Care | Attending: Emergency Medicine | Admitting: Emergency Medicine

## 2024-02-09 ENCOUNTER — Encounter (HOSPITAL_BASED_OUTPATIENT_CLINIC_OR_DEPARTMENT_OTHER): Payer: Self-pay | Admitting: Emergency Medicine

## 2024-02-09 ENCOUNTER — Emergency Department (HOSPITAL_BASED_OUTPATIENT_CLINIC_OR_DEPARTMENT_OTHER)

## 2024-02-09 DIAGNOSIS — F1027 Alcohol dependence with alcohol-induced persisting dementia: Secondary | ICD-10-CM | POA: Insufficient documentation

## 2024-02-09 DIAGNOSIS — N3000 Acute cystitis without hematuria: Secondary | ICD-10-CM | POA: Diagnosis not present

## 2024-02-09 DIAGNOSIS — R109 Unspecified abdominal pain: Secondary | ICD-10-CM | POA: Diagnosis present

## 2024-02-09 DIAGNOSIS — Z79899 Other long term (current) drug therapy: Secondary | ICD-10-CM | POA: Insufficient documentation

## 2024-02-09 DIAGNOSIS — I2510 Atherosclerotic heart disease of native coronary artery without angina pectoris: Secondary | ICD-10-CM | POA: Insufficient documentation

## 2024-02-09 DIAGNOSIS — I10 Essential (primary) hypertension: Secondary | ICD-10-CM | POA: Insufficient documentation

## 2024-02-09 LAB — COMPREHENSIVE METABOLIC PANEL WITH GFR
ALT: 10 U/L (ref 0–44)
AST: 21 U/L (ref 15–41)
Albumin: 4.6 g/dL (ref 3.5–5.0)
Alkaline Phosphatase: 87 U/L (ref 38–126)
Anion gap: 11 (ref 5–15)
BUN: 16 mg/dL (ref 8–23)
CO2: 21 mmol/L — ABNORMAL LOW (ref 22–32)
Calcium: 9.8 mg/dL (ref 8.9–10.3)
Chloride: 105 mmol/L (ref 98–111)
Creatinine, Ser: 1.13 mg/dL (ref 0.61–1.24)
GFR, Estimated: >60 mL/min (ref >60)
Glucose, Bld: 101 mg/dL — ABNORMAL HIGH (ref 70–99)
Potassium: 4 mmol/L (ref 3.5–5.1)
Sodium: 137 mmol/L (ref 135–145)
Total Bilirubin: 0.9 mg/dL (ref 0.0–1.2)
Total Protein: 8.5 g/dL — ABNORMAL HIGH (ref 6.5–8.1)

## 2024-02-09 LAB — URINALYSIS, ROUTINE W REFLEX MICROSCOPIC
Bilirubin Urine: NEGATIVE
Cellular Cast, UA: 1
Glucose, UA: NEGATIVE mg/dL
Hgb urine dipstick: NEGATIVE
Ketones, ur: NEGATIVE mg/dL
Nitrite: NEGATIVE
Specific Gravity, Urine: 1.014 (ref 1.005–1.030)
pH: 5.5 (ref 5.0–8.0)

## 2024-02-09 LAB — CBC
HCT: 42.2 % (ref 39.0–52.0)
Hemoglobin: 14.5 g/dL (ref 13.0–17.0)
MCH: 36.3 pg — ABNORMAL HIGH (ref 26.0–34.0)
MCHC: 34.4 g/dL (ref 30.0–36.0)
MCV: 105.8 fL — ABNORMAL HIGH (ref 80.0–100.0)
Platelets: 170 10*3/uL (ref 150–400)
RBC: 3.99 MIL/uL — ABNORMAL LOW (ref 4.22–5.81)
RDW: 15.2 % (ref 11.5–15.5)
WBC: 5.2 10*3/uL (ref 4.0–10.5)
nRBC: 0 % (ref 0.0–0.2)

## 2024-02-09 LAB — LIPASE, BLOOD: Lipase: 47 U/L (ref 11–51)

## 2024-02-09 MED ORDER — CEPHALEXIN 500 MG PO CAPS: 500.0000 mg | ORAL_CAPSULE | Freq: Four times a day (QID) | ORAL | 0 refills | Status: AC

## 2024-02-09 NOTE — ED Notes (Signed)
 Pt given discharge instructions. Opportunities given for questions. Pt verbalizes understanding. Jillyn Hidden, RN

## 2024-02-09 NOTE — ED Triage Notes (Signed)
 Left side flank pain and back  Started Saturday. Denies urinary symptoms. No incontinence or numbness. Intensity comes and goes

## 2024-02-09 NOTE — Discharge Instructions (Addendum)
 Your bladder wall is thickened.  It could be from difficulty emptying the bladder or from the urinary tract infection.  Follow-up with either our urologist or the [place].  Take the antibiotics to help with the infection.

## 2024-02-09 NOTE — ED Provider Notes (Signed)
 [place] EMERGENCY DEPARTMENT AT [facility] Provider Note   CSN: [id number] Arrival date & time: 02/09/24  1319     History  Chief Complaint  Patient presents with   Flank Pain   Back Pain    Michael Sharp is a 89 y.o. male.   Flank Pain  Back Pain Patient presents with left-sided abdominal pain that goes to the back.  Has had for almost a week.  No urinary symptoms.  No numbness or weakness.  Somewhat coming and going.  No fevers.  Is worse with certain movements.  No fall.    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 Authorizing Provider  alendronate (FOSAMAX) 70 MG/75ML solution Take 70 mg by mouth every 7 (seven) days. Take with a full glass of water on an empty stomach.   Yes [provider]  amLODipine (NORVASC) 10 MG tablet TAKE 1 TABLET DAILY 10/02/17  Yes Hollis, Lachina M, FNP  atorvastatin (LIPITOR) 20 MG tablet TAKE 1 TABLET DAILY 02/12/18  Yes Buena Carmine, NP  cephALEXin (KEFLEX) 500 MG capsule Take 1 capsule (500 mg total) by mouth 4 (four) times daily. 02/09/24  Yes Mozell Arias, MD  colchicine 0.6 MG tablet Take 0.6 mg by mouth daily.   Yes [provider]  ferrous sulfate 325 (65 FE) MG tablet Take 325 mg by mouth daily with breakfast.   Yes [provider]  folic acid (FOLVITE) 1 MG tablet TAKE 1 TABLET DAILY 12/04/17  Yes Hollis, Lachina M, FNP  GALANTAMINE HYDROBROMIDE PO Take 16 mg by mouth once.   Yes [provider]  memantine (NAMENDA) 10 MG tablet Take 20 mg by mouth at bedtime.   Yes [provider]  Multiple Vitamin (MULTIVITAMIN) capsule Take 1 capsule by mouth daily.   Yes [provider]  naltrexone (DEPADE) 50 MG tablet Take 50 mg by mouth daily.   Yes [provider]  QUEtiapine (SEROQUEL) 100 MG tablet Take 100 mg by mouth at bedtime.   Yes [provider]  thiamine 100 MG tablet Take 100 mg by mouth daily.   Yes [provider]  allopurinol (ZYLOPRIM) 100 MG tablet TAKE 1 TABLET(100 MG) BY MOUTH TWICE DAILY 08/10/20   Persons, Norma Beckers, PA  donepezil (ARICEPT) 5 MG tablet TAKE 1 TABLET(5 MG) BY MOUTH AT BEDTIME 02/07/18   Hollis, Lachina M, FNP  gabapentin (NEURONTIN) 300 MG capsule TAKE 1 CAPSULE AT BEDTIME 12/04/17 05/31/20  Hollis, Lachina M, FNP  hydrochlorothiazide (HYDRODIURIL) 25 MG tablet TAKE 1 TABLET DAILY 02/22/18 05/31/20  Hollis, Lachina M, FNP      Allergies    Patient has no known allergies.    Review of Systems   Review of Systems  Genitourinary:  Positive for flank pain.  Musculoskeletal:  Positive for back pain.    Physical Exam Updated Vital Signs BP (!) 143/71   Pulse 70   Temp 97.9 F (36.6 C)   Resp 17   SpO2 100%  Physical Exam Vitals and nursing  note reviewed.  HENT:     Head: Normocephalic.  Chest:     Chest wall: No tenderness.  Abdominal:     Tenderness: There is no abdominal tenderness.  Musculoskeletal:     Comments: No low back rash.  Does have some mild left-sided low back tenderness.  Skin:    Capillary Refill: Capillary refill takes less than 2 seconds.  Neurological:     Mental Status: He is alert. Mental status is at baseline.     ED Results / Procedures / Treatments   Labs (all labs ordered are listed, but only abnormal results are displayed) Labs Reviewed  COMPREHENSIVE METABOLIC PANEL WITH GFR - Abnormal; Notable for the following components:      Result Value   CO2 21 (*)    Glucose, Bld 101 (*)    Total Protein 8.5 (*)    All other components within normal limits  CBC -  Abnormal; Notable for the following components:   RBC 3.99 (*)    MCV 105.8 (*)    MCH 36.3 (*)    All other components within normal limits  URINALYSIS, ROUTINE W REFLEX MICROSCOPIC - Abnormal; Notable for the following components:   Protein, ur TRACE (*)    Leukocytes,Ua TRACE (*)    Bacteria, UA RARE (*)    All other components within normal limits  LIPASE, BLOOD    EKG None  Radiology CT Renal Stone Study Result Date: 02/09/2024 CLINICAL DATA:  Abdominal/flank pain, stone suspected EXAM: CT ABDOMEN AND PELVIS WITHOUT CONTRAST TECHNIQUE: Multidetector CT imaging of the abdomen and pelvis was performed following the standard protocol without IV contrast. RADIATION DOSE REDUCTION: This exam was performed according to the departmental dose-optimization program which includes automated exposure control, adjustment of the mA and/or kV according to patient size and/or use of iterative reconstruction technique. COMPARISON:  January 12, 2015, December 29, 2014 FINDINGS: Lower chest: No focal airspace consolidation or pleural effusion.Fibro linear scarring in the lung bases. Hepatobiliary: Unchanged subcentimeter hypodensity within the posterior left hepatic lobe (axial 11), likely a small cyst or biliary hamartoma.No radiopaque stones or wall thickening of the gallbladder.No intrahepatic or extrahepatic biliary ductal dilation. Pancreas: No mass or main ductal dilation.No peripancreatic inflammation or fluid collection. Spleen: Normal size. No mass. Adrenals/Urinary Tract: No adrenal masses. Unchanged cyst in the left kidney. No mass. No hydronephrosis or nephrolithiasis. Mild circumferential wall thickening of the urinary bladder. Stomach/Bowel:Small sliding-type hiatal hernia. Decompressed stomach. No small bowel wall thickening or inflammation. No small bowel obstruction. Normal appendix. Total colonic diverticulosis. No changes of acute diverticulitis. Vascular/Lymphatic: No aortic aneurysm. Diffuse  aortoiliac atherosclerosis. No intraabdominal or pelvic lymphadenopathy. Reproductive: Severe prostatomegaly.No free pelvic fluid. Other: No pneumoperitoneum, ascites, or mesenteric inflammation. Musculoskeletal: No acute fracture or destructive lesion.Diffuse osteopenia. Multilevel degenerative disc disease of the spine. Mild to moderate bilateral hip osteoarthritis. IMPRESSION: 1. Circumferential wall thickening of the urinary bladder, which may be due to chronic bladder outlet obstruction or acute cystitis. Correlation with urinalysis is recommended. 2. Severe prostatomegaly. 3. No hydronephrosis or nephrolithiasis. Electronically Signed   By: Rance Burrows M.D.   On: 02/09/2024 17:44    Procedures Procedures    Medications Ordered in ED Medications - No data to display  ED Course/ Medical Decision Making/ A&P                                 Medical Decision Making Amount and/or Complexity  of Data Reviewed Labs: ordered. Radiology: ordered.  Risk Prescription drug management.   Patient with lower abdominal pain and back pain.  Somewhat come and goes.  Differential diagnose includes causes such as UTI, kidney stone, scleral pain.  Shingles felt less likely with no rash.  Urine does show likely UTI.  With the flank pain will get CT scan to evaluate for stones.  CT scan showed no stones but showed likely cystitis.  Will treat.  Appears stable for discharge home.        Final Clinical Impression(s) / ED Diagnoses Final diagnoses:  Acute cystitis without hematuria    Rx / DC Orders ED Discharge Orders          Ordered    cephALEXin (KEFLEX) 500 MG capsule  4 times daily        02/09/24 1801              Mozell Arias, MD 02/10/24 1439
